# Patient Record
Sex: Female | Born: 1935 | Race: White | Hispanic: No | Smoking: Never smoker
Health system: Southern US, Community
[De-identification: ages and names within clinical notes are randomized; demographics above are authoritative.]

## PROBLEM LIST (undated history)

## (undated) ENCOUNTER — Emergency Department (HOSPITAL_BASED_OUTPATIENT_CLINIC_OR_DEPARTMENT_OTHER): Discharge status: Against Medical Advice | Payer: Medicare Other

## (undated) DIAGNOSIS — E079 Disorder of thyroid, unspecified: Secondary | ICD-10-CM

## (undated) DIAGNOSIS — D649 Anemia, unspecified: Secondary | ICD-10-CM

## (undated) DIAGNOSIS — N200 Calculus of kidney: Secondary | ICD-10-CM

## (undated) DIAGNOSIS — I10 Essential (primary) hypertension: Secondary | ICD-10-CM

## (undated) DIAGNOSIS — K219 Gastro-esophageal reflux disease without esophagitis: Secondary | ICD-10-CM

## (undated) DIAGNOSIS — F0390 Unspecified dementia without behavioral disturbance: Secondary | ICD-10-CM

## (undated) HISTORY — PX: KIDNEY STONE SURGERY: SHX686

---

## 1999-03-04 ENCOUNTER — Encounter: Payer: Self-pay | Admitting: Emergency Medicine

## 1999-03-04 ENCOUNTER — Inpatient Hospital Stay (HOSPITAL_COMMUNITY): Admission: EM | Admit: 1999-03-04 | Discharge: 1999-03-05 | Payer: Self-pay | Admitting: Emergency Medicine

## 1999-06-26 ENCOUNTER — Emergency Department (HOSPITAL_COMMUNITY): Admission: EM | Admit: 1999-06-26 | Discharge: 1999-06-26 | Payer: Self-pay | Admitting: *Deleted

## 1999-08-25 ENCOUNTER — Encounter: Payer: Self-pay | Admitting: *Deleted

## 1999-08-25 ENCOUNTER — Encounter: Admission: RE | Admit: 1999-08-25 | Discharge: 1999-08-25 | Payer: Self-pay | Admitting: *Deleted

## 2000-03-08 ENCOUNTER — Other Ambulatory Visit: Admission: RE | Admit: 2000-03-08 | Discharge: 2000-03-08 | Payer: Self-pay | Admitting: *Deleted

## 2001-12-13 ENCOUNTER — Encounter: Payer: Self-pay | Admitting: *Deleted

## 2001-12-13 ENCOUNTER — Encounter: Admission: RE | Admit: 2001-12-13 | Discharge: 2001-12-13 | Payer: Self-pay | Admitting: *Deleted

## 2003-03-27 ENCOUNTER — Ambulatory Visit (HOSPITAL_COMMUNITY): Admission: RE | Admit: 2003-03-27 | Discharge: 2003-03-27 | Payer: Self-pay

## 2004-03-11 ENCOUNTER — Encounter: Admission: RE | Admit: 2004-03-11 | Discharge: 2004-03-11 | Payer: Self-pay | Admitting: Family Medicine

## 2005-04-13 ENCOUNTER — Inpatient Hospital Stay (HOSPITAL_COMMUNITY): Admission: EM | Admit: 2005-04-13 | Discharge: 2005-04-14 | Payer: Self-pay | Admitting: Emergency Medicine

## 2005-07-10 ENCOUNTER — Emergency Department (HOSPITAL_COMMUNITY): Admission: EM | Admit: 2005-07-10 | Discharge: 2005-07-10 | Payer: Self-pay | Admitting: Emergency Medicine

## 2005-09-15 ENCOUNTER — Encounter (HOSPITAL_COMMUNITY): Admission: RE | Admit: 2005-09-15 | Discharge: 2005-12-14 | Payer: Self-pay | Admitting: Nephrology

## 2005-12-23 ENCOUNTER — Encounter (HOSPITAL_COMMUNITY): Admission: RE | Admit: 2005-12-23 | Discharge: 2006-03-23 | Payer: Self-pay | Admitting: Nephrology

## 2006-02-09 ENCOUNTER — Ambulatory Visit: Payer: Self-pay

## 2006-04-08 ENCOUNTER — Encounter (HOSPITAL_COMMUNITY): Admission: RE | Admit: 2006-04-08 | Discharge: 2006-07-02 | Payer: Self-pay | Admitting: Nephrology

## 2006-07-06 ENCOUNTER — Emergency Department (HOSPITAL_COMMUNITY): Admission: EM | Admit: 2006-07-06 | Discharge: 2006-07-06 | Payer: Self-pay | Admitting: Emergency Medicine

## 2006-07-29 ENCOUNTER — Encounter (HOSPITAL_COMMUNITY): Admission: RE | Admit: 2006-07-29 | Discharge: 2006-10-27 | Payer: Self-pay | Admitting: Nephrology

## 2006-10-05 ENCOUNTER — Observation Stay (HOSPITAL_COMMUNITY): Admission: EM | Admit: 2006-10-05 | Discharge: 2006-10-06 | Payer: Self-pay | Admitting: Emergency Medicine

## 2006-10-16 ENCOUNTER — Emergency Department (HOSPITAL_COMMUNITY): Admission: EM | Admit: 2006-10-16 | Discharge: 2006-10-16 | Payer: Self-pay | Admitting: Emergency Medicine

## 2006-10-28 ENCOUNTER — Encounter: Admission: RE | Admit: 2006-10-28 | Discharge: 2006-10-28 | Payer: Self-pay | Admitting: Family Medicine

## 2006-11-23 ENCOUNTER — Emergency Department (HOSPITAL_COMMUNITY): Admission: EM | Admit: 2006-11-23 | Discharge: 2006-11-23 | Payer: Self-pay | Admitting: Emergency Medicine

## 2006-11-25 ENCOUNTER — Encounter (HOSPITAL_COMMUNITY): Admission: RE | Admit: 2006-11-25 | Discharge: 2007-02-23 | Payer: Self-pay | Admitting: Nephrology

## 2007-03-22 ENCOUNTER — Encounter (HOSPITAL_COMMUNITY): Admission: RE | Admit: 2007-03-22 | Discharge: 2007-06-20 | Payer: Self-pay | Admitting: Nephrology

## 2007-06-21 ENCOUNTER — Encounter (HOSPITAL_COMMUNITY): Admission: RE | Admit: 2007-06-21 | Discharge: 2007-09-19 | Payer: Self-pay | Admitting: Nephrology

## 2007-07-29 ENCOUNTER — Emergency Department (HOSPITAL_COMMUNITY): Admission: EM | Admit: 2007-07-29 | Discharge: 2007-07-29 | Payer: Self-pay | Admitting: Emergency Medicine

## 2007-09-23 ENCOUNTER — Encounter (HOSPITAL_COMMUNITY): Admission: RE | Admit: 2007-09-23 | Discharge: 2007-10-27 | Payer: Self-pay | Admitting: Nephrology

## 2007-11-24 ENCOUNTER — Encounter (HOSPITAL_COMMUNITY): Admission: RE | Admit: 2007-11-24 | Discharge: 2008-02-22 | Payer: Self-pay | Admitting: Nephrology

## 2008-01-14 ENCOUNTER — Emergency Department (HOSPITAL_COMMUNITY): Admission: EM | Admit: 2008-01-14 | Discharge: 2008-01-14 | Payer: Self-pay | Admitting: Emergency Medicine

## 2008-01-20 ENCOUNTER — Emergency Department (HOSPITAL_COMMUNITY): Admission: EM | Admit: 2008-01-20 | Discharge: 2008-01-20 | Payer: Self-pay | Admitting: Emergency Medicine

## 2008-02-29 ENCOUNTER — Encounter (HOSPITAL_COMMUNITY): Admission: RE | Admit: 2008-02-29 | Discharge: 2008-05-29 | Payer: Self-pay | Admitting: Nephrology

## 2008-06-20 ENCOUNTER — Encounter (HOSPITAL_COMMUNITY): Admission: RE | Admit: 2008-06-20 | Discharge: 2008-09-18 | Payer: Self-pay | Admitting: Nephrology

## 2008-06-21 ENCOUNTER — Emergency Department (HOSPITAL_BASED_OUTPATIENT_CLINIC_OR_DEPARTMENT_OTHER): Admission: EM | Admit: 2008-06-21 | Discharge: 2008-06-21 | Payer: Self-pay | Admitting: Emergency Medicine

## 2008-07-06 ENCOUNTER — Emergency Department (HOSPITAL_BASED_OUTPATIENT_CLINIC_OR_DEPARTMENT_OTHER): Admission: EM | Admit: 2008-07-06 | Discharge: 2008-07-06 | Payer: Self-pay | Admitting: Emergency Medicine

## 2008-09-19 ENCOUNTER — Encounter (HOSPITAL_COMMUNITY): Admission: RE | Admit: 2008-09-19 | Discharge: 2008-10-18 | Payer: Self-pay | Admitting: Nephrology

## 2008-11-13 ENCOUNTER — Encounter (HOSPITAL_COMMUNITY): Admission: RE | Admit: 2008-11-13 | Discharge: 2009-02-11 | Payer: Self-pay | Admitting: Nephrology

## 2009-02-20 ENCOUNTER — Encounter (HOSPITAL_COMMUNITY): Admission: RE | Admit: 2009-02-20 | Discharge: 2009-05-21 | Payer: Self-pay | Admitting: Nephrology

## 2009-04-27 ENCOUNTER — Emergency Department (HOSPITAL_BASED_OUTPATIENT_CLINIC_OR_DEPARTMENT_OTHER): Admission: EM | Admit: 2009-04-27 | Discharge: 2009-04-27 | Payer: Self-pay | Admitting: Emergency Medicine

## 2009-06-11 ENCOUNTER — Encounter (HOSPITAL_COMMUNITY): Admission: RE | Admit: 2009-06-11 | Discharge: 2009-09-09 | Payer: Self-pay | Admitting: Nephrology

## 2009-08-26 ENCOUNTER — Emergency Department (HOSPITAL_BASED_OUTPATIENT_CLINIC_OR_DEPARTMENT_OTHER): Admission: EM | Admit: 2009-08-26 | Discharge: 2009-08-26 | Payer: Self-pay | Admitting: Psychiatry

## 2009-08-26 ENCOUNTER — Ambulatory Visit: Payer: Self-pay | Admitting: Diagnostic Radiology

## 2009-09-10 ENCOUNTER — Encounter (HOSPITAL_COMMUNITY): Admission: RE | Admit: 2009-09-10 | Discharge: 2009-12-09 | Payer: Self-pay | Admitting: Nephrology

## 2009-09-18 ENCOUNTER — Ambulatory Visit: Payer: Self-pay | Admitting: Radiology

## 2009-09-18 ENCOUNTER — Emergency Department (HOSPITAL_BASED_OUTPATIENT_CLINIC_OR_DEPARTMENT_OTHER): Admission: EM | Admit: 2009-09-18 | Discharge: 2009-09-18 | Payer: Self-pay | Admitting: Emergency Medicine

## 2009-12-18 ENCOUNTER — Encounter (HOSPITAL_COMMUNITY): Admission: RE | Admit: 2009-12-18 | Discharge: 2010-03-18 | Payer: Self-pay | Admitting: Nephrology

## 2010-01-13 ENCOUNTER — Ambulatory Visit: Payer: Self-pay | Admitting: Diagnostic Radiology

## 2010-01-13 ENCOUNTER — Emergency Department (HOSPITAL_BASED_OUTPATIENT_CLINIC_OR_DEPARTMENT_OTHER): Admission: EM | Admit: 2010-01-13 | Discharge: 2010-01-13 | Payer: Self-pay | Admitting: Emergency Medicine

## 2010-03-26 ENCOUNTER — Encounter (HOSPITAL_COMMUNITY): Admission: RE | Admit: 2010-03-26 | Discharge: 2010-06-24 | Payer: Self-pay | Admitting: Nephrology

## 2010-07-02 ENCOUNTER — Encounter (HOSPITAL_COMMUNITY)
Admission: RE | Admit: 2010-07-02 | Discharge: 2010-09-30 | Payer: Self-pay | Source: Home / Self Care | Attending: Nephrology | Admitting: Nephrology

## 2010-08-10 ENCOUNTER — Emergency Department (HOSPITAL_BASED_OUTPATIENT_CLINIC_OR_DEPARTMENT_OTHER): Admission: EM | Admit: 2010-08-10 | Discharge: 2010-08-10 | Payer: Self-pay | Admitting: Emergency Medicine

## 2010-10-15 ENCOUNTER — Observation Stay (HOSPITAL_COMMUNITY)
Admission: EM | Admit: 2010-10-15 | Discharge: 2010-10-15 | Payer: Self-pay | Source: Home / Self Care | Attending: Internal Medicine | Admitting: Internal Medicine

## 2010-11-05 ENCOUNTER — Encounter (HOSPITAL_COMMUNITY)
Admission: RE | Admit: 2010-11-05 | Discharge: 2010-11-18 | Payer: Self-pay | Source: Home / Self Care | Attending: Nephrology | Admitting: Nephrology

## 2010-11-08 ENCOUNTER — Observation Stay (HOSPITAL_COMMUNITY): Admission: EM | Admit: 2010-11-08 | Discharge: 2010-11-09 | Payer: Self-pay | Source: Home / Self Care

## 2010-11-08 NOTE — H&P (Addendum)
NAMEMarland Newman  Tonya, Newman             ACCOUNT NO.:  [id number]  MEDICAL RECORD NO.:  [id number]          PATIENT TYPE:  EMS  LOCATION:  MAJO                         FACILITY:  [facility]  PHYSICIAN:  Homero Fellers, MD   DATE OF BIRTH:  12/28/1910  DATE OF ADMISSION:  11/08/2010 DATE OF DISCHARGE:                             HISTORY & PHYSICAL   PRIMARY CARE PHYSICIAN:  Dr. Riley Nearing.  She is unassigned  CHIEF COMPLAINTS:  Confusion, weakness and gait problems.  HISTORY OF PRESENT ILLNESS:  This is a 77 year old woman who lives at home with family at times, but also lives by herself during the day. She was noticed by family to be very confused today and quite weak also mainly on left side.  The patient also was unable to work as she normally could do in the past.  The family noticed that she was going to the left side when they tried to sit up in chair and she could not stand on her feet.  The patient was also confused for about several hours which was getting better about 2 hours ago.  The patient denies any chest pain, shortness of breath, nausea, vomiting or diaphoresis.  She had a stroke about 6 years ago with left-sided weakness which cleared up within 2 months.  She is currently on Plavix for this.  The patient did not have any significant speech changes, seizure activity noticeable by family members.  About 2 days ago, she did fell out of bed but this cleared up before to this episode.  PAST MEDICAL HISTORY:  History of CVA with no current residual weakness, high blood pressure, anxiety disorder, hypothyroidism, gastroesophageal disease, history of skin cancer.  MEDICATION:  Amlodipine, lorazepam, Plavix, and levothyroxine.  ALLERGIES:  To ASPIRIN and NITROFURANTOIN.  SOCIAL HISTORY:  No smoking or alcohol.  The patient lives alone but family members stay with her at night.  FAMILY HISTORY:  Contributory.  REVIEW OF SYSTEMS:  10-point review of system is essentially  negative except as above.  There is no urinary symptoms, vomiting, diarrhea or abdominal pain.  No history to suggest hematuria or active kidney stone problems at this time.  PHYSICAL EXAMINATION:  VITAL SIGNS:  Blood pressure 114/55 to 164/88, pulse 77, respirations 14, temperature 98.4, O2 sat 99% on room air. GENERAL:  The patient is comfortable at this time.  Her confusion seems to have improved. NECK:  Supple.  No JVD, adenopathy or thyromegaly. HEENT:  Mouth is dry. LUNGS:  Clear to auscultation bilaterally. HEART:  S1-S2 with 2/6 systolic murmur at the apex, nonradiating. ABDOMEN:  Full, soft, nontender.  Bowel sounds present.  No masses. EXTREMITIES:  No edema, clubbing or cyanosis. NEUROLOGIC:  The patient appears to be awake and alert x3 at this time. Cranial nerves II-XII appear intact.  Speech is clear.  When I tried to sit her up she was unable to maintain a posture and appeared to go to the left side.  She also seemed to have power of 4/5 in the left lower extremity.  Power is normal in all the other extremities.  Cognition appeared to be preserved in the  upper extremities.  Gait was not tested.  LABORATORY DATA:  Urinalysis unremarkable.  White count is 7.2, hemoglobin 12.0, platelet count 152.  Chemistry; sodium is 140, potassium 3.8, BUN 28, creatinine 1.6, glucose 155.  CT of the head showed no acute intracranial abnormalities with chronic small vessel ischemic changes.  She has a stable chronic lacunar infarct in the posterior limb of right internal capsule probably related to a old stroke.  Cardiac enzymes x1 is normal.  Chest x-ray showed no acute findings.  EKG showed normal sinus rhythm with right axis deviation.  No history of T-wave changes.  ASSESSMENT: 1. This is a 77 year old woman with fairly good health, admitted with     weakness on the left side, gait instability and transient     confusion.  The concern here is to rule out another stroke in this      patient with known history of cerebrovascular accident. 2. Uncontrolled high blood pressure. 3. Clinical dehydration with dry mouth and elevated BUN and     creatinine. 4. History of anxiety.  PLAN:  To admit to telemetry floor observation.  She will get full stroke workup including MRI, MRA of the brain, 2-D echo and carotid Doppler.  We will give her some free fluid for dehydration and follow BNP in 24 hours.  Check lipid profile, cardiac enzymes, B12 and folic acid levels.  The patient will continue on Plavix for now.  If stroke is confirmed, she might need to be changed to Aggrenox.  The patient however does have an ASPIRIN allergy, so this might quite be a challenge.  If MRI showed acute stroke, I will recommend a neurological consultation.  For now, she will be on DVT prophylaxis.  Her condition is stable and 49 minutes spent.     Homero Fellers, MD     FA/MEDQ  D:  11/08/2010  T:  11/08/2010  Job:  [id number]  Electronically Signed by Homero Fellers  on 11/08/2010 11:13:55 PM

## 2010-11-10 LAB — IRON AND TIBC
Iron: 92 ug/dL (ref 42–135)
TIBC: 280 ug/dL (ref 250–470)
UIBC: 188 ug/dL

## 2010-11-10 LAB — FERRITIN: Ferritin: 138 ng/mL (ref 10–291)

## 2010-11-10 LAB — POCT HEMOGLOBIN-HEMACUE: Hemoglobin: 11.8 g/dL — ABNORMAL LOW (ref 12.0–15.0)

## 2010-11-11 LAB — CARDIAC PANEL(CRET KIN+CKTOT+MB+TROPI)
CK, MB: 1.4 ng/mL (ref 0.3–4.0)
Relative Index: INVALID (ref 0.0–2.5)
Relative Index: INVALID (ref 0.0–2.5)
Total CK: 60 U/L (ref 7–177)
Total CK: 60 U/L (ref 7–177)
Troponin I: 0.01 ng/mL (ref 0.00–0.06)

## 2010-11-11 LAB — BASIC METABOLIC PANEL
CO2: 27 mEq/L (ref 19–32)
Chloride: 105 mEq/L (ref 96–112)
GFR calc Af Amer: 44 mL/min — ABNORMAL LOW (ref >60)
Sodium: 142 mEq/L (ref 135–145)

## 2010-11-11 LAB — POCT I-STAT, CHEM 8
BUN: 28 mg/dL — ABNORMAL HIGH (ref 6–23)
Chloride: 108 mEq/L (ref 96–112)
Potassium: 3.8 mEq/L (ref 3.5–5.1)
Sodium: 140 mEq/L (ref 135–145)

## 2010-11-11 LAB — FOLATE: Folate: >20 ng/mL

## 2010-11-11 LAB — POCT CARDIAC MARKERS
CKMB, poc: 2.2 ng/mL (ref 1.0–8.0)
Myoglobin, poc: 162 ng/mL (ref 12–200)
Troponin i, poc: <0.05 ng/mL (ref 0.00–0.09)

## 2010-11-11 LAB — MAGNESIUM: Magnesium: 2.4 mg/dL (ref 1.5–2.5)

## 2010-11-11 LAB — CK TOTAL AND CKMB (NOT AT ARMC)
CK, MB: 1.9 ng/mL (ref 0.3–4.0)
Relative Index: INVALID (ref 0.0–2.5)

## 2010-11-11 LAB — URINALYSIS, ROUTINE W REFLEX MICROSCOPIC
Bilirubin Urine: NEGATIVE
Protein, ur: NEGATIVE mg/dL

## 2010-11-11 LAB — CBC
Hemoglobin: 12 g/dL (ref 12.0–15.0)
MCH: 31.3 pg (ref 26.0–34.0)
Platelets: 182 10*3/uL (ref 150–400)
RBC: 3.84 MIL/uL — ABNORMAL LOW (ref 3.87–5.11)
RDW: 13.1 % (ref 11.5–15.5)

## 2010-11-11 LAB — LIPID PANEL
Cholesterol: 205 mg/dL — ABNORMAL HIGH (ref 0–200)
LDL Cholesterol: 135 mg/dL — ABNORMAL HIGH (ref 0–99)
VLDL: 27 mg/dL (ref 0–40)

## 2010-11-11 LAB — DIFFERENTIAL
Eosinophils Relative: 1 % (ref 0–5)
Monocytes Relative: 9 % (ref 3–12)
Neutro Abs: 4.9 10*3/uL (ref 1.7–7.7)
Neutrophils Relative %: 67 % (ref 43–77)

## 2010-11-11 LAB — URINE CULTURE: Colony Count: 7000

## 2010-11-11 LAB — PROTIME-INR
INR: 0.91 (ref 0.00–1.49)
Prothrombin Time: 12.5 seconds (ref 11.6–15.2)

## 2010-11-11 LAB — VITAMIN B12: Vitamin B-12: 391 pg/mL (ref 211–911)

## 2010-11-13 NOTE — Discharge Summary (Addendum)
Tonya Newman, Tonya Newman             ACCOUNT NO.:  [id number]  MEDICAL RECORD NO.:  [id number]          PATIENT TYPE:  INP  LOCATION:  3701                         FACILITY:  [facility]  PHYSICIAN:  Hollice Espy, M.D.DATE OF BIRTH:  [birth year]  DATE OF ADMISSION:  11/08/2010 DATE OF DISCHARGE:  11/09/2010                              DISCHARGE SUMMARY   PRIMARY CARE PHYSICIAN:  Bernadette Hoit, MD, [facility], [place].  DISCHARGE DIAGNOSES: 1. Generalized weakness, resolved. 2. Altered mental status, resolved. 3. Acute renal failure, on stage II chronic kidney disease now back at     baseline.  DISCHARGE MEDICATIONS: 1. Norvasc 10 p.o. daily. 2. Folate 1 mg p.o. daily. 3. Synthroid 50 mcg p.o. daily. 4. Lorazepam 0.5 p.o. b.i.d. p.r.n. 5. Plavix 75 p.o. daily.  DISCHARGE DIET:  Heart-healthy diet.  ACTIVITY:  Slow to increased.  The patient is cleared to ambulate by herself.  DISPOSITION:  Improved.  HOSPITAL COURSE:  The patient is a 77 year old white female discharged from the Hospitalist Service.  Last month, she had had a diagnosis of a stroke at an indeterminate time.  She came back in and that she has been in excellent health, lived by herself, very active including mowing the lawn, cooking and cleaning.  When her family noted that she seemed to have some problems with confusion on November 08, 2010 as well as seemed to have some have generalized weakness, there was a concern that she may have a new stroke as there were some reports that she perhaps was favoring her left side.  However, when she was evaluated, she was noted to have a creatinine of 1.6 with an increased BUN.  Family reported that 2 days prior, the patient had fallen out of bed when she tripped in the middle of the night.  She was additionally evaluated.  Initial CT scan of the head and chest x-ray were negative.  No signs of any infection. No signs of urinary tract infection.   The patient is not on any diuretics and her blood pressures have remained stable assuring no symptoms of orthostasis or severe hypertension.  Following IV fluids, her followup labs were 1.3 and the patient does have normal full mentation.  She is able to converse.  Family feels that she is at baseline.  When the patient was ambulated, she used her IV pole for balance with wheels and was able to ambulate rather quickly and very well with a quite steady balance.  She had no focal neurological findings and her exam is completely unremarkable.  After discussion with the patient and the family, I feel that likely what has happened is this patient fell out of bed several days ago that led to some mild dehydration confirmed by her increased creatinine to 1.6.  Following hydration, she is back to her baseline and now she is able to relatively ambulate well.  The patient normally walks unassisted.  The family says that occasionally she might use objects in her house such as the washer while walking by to steady herself.  They have tried to get her to use a cane which she is  resistant to, however, I noted that with a wheeled IV pole, the patient actually has even better balance and is able to ambulate quite quickly and I advised the family that if they wanted her to use an object for balance recommend something with wheels.  I advised the patient to be able to discharge home. Disposition, improved. Activity is slow to increase.  The family will stay with her tonight and she spends every night with her son.  She will follow up with Dr. Riley Nearing in the next few weeks.     Hollice Espy, M.D.     SKK/MEDQ  D:  11/09/2010  T:  11/10/2010  Job:  [id number]  cc:   Bernadette Hoit, M.D.  Electronically Signed by Virginia Rochester M.D. on 11/13/2010 08:09:14 AM

## 2010-11-14 NOTE — H&P (Signed)
NAMEMarland Kitchen  Tonya Newman, Tonya Newman NO.:  [id number]  MEDICAL RECORD NO.:  [id number]          PATIENT TYPE:  EMS  LOCATION:  MAJO                         FACILITY:  [facility]  PHYSICIAN:  Massie Maroon, MD        DATE OF BIRTH:  [birth year]  DATE OF ADMISSION:  10/15/2010 DATE OF DISCHARGE:                             HISTORY & PHYSICAL   A 77 year old female with a history of hypertension, stroke, hypothyroidism, apparently complains of feeling badly.  The patient was brought to the ED by her family by EMS, and apparently ER thought that she had complained of some chest pain and did an EKG which showed some ST depression in the lateral leads.  ER talked to Dr. Raphael Gibney on for Cardiology who thought that this was not a cardiac issue.  The patient herself complains of being asymptomatic.  She said that her bad spell "anxiety."  The patient states that she feels fine and actually desires to go home at this point.  ER wishes her be admitted for observation for another set of cardiac markers in light of her EKG changes.  PAST MEDICAL HISTORY: 1. Hypertension. 2. CVA. 3. Hypothyroidism. 4. GERD. 5. Nephrolithiasis. 6. Skin cancer.  PAST SURGICAL HISTORY:  Skin cancer removal from her nose as well as left neck.  SOCIAL HISTORY:  The patient lives alone at home.  She has never smoked. [place]  FAMILY HISTORY:  None per the patient.  ALLERGIES:  ASPIRIN, NITROFURANTOIN.  MEDICATIONS: 1. Amlodipine 5 mg p.o. daily. 2. Lac-Hydrin topically b.i.d. 3. Aranesp 100 mcg as needed. 4. Folic acid 400 mcg p.o. daily. 5. Levothyroxine 50 mcg p.o. daily. 6. Lisinopril 10 mg p.o. daily. 7. Lorazepam 0.5 mg p.o. b.i.d. p.r.n. 8. Omeprazole 20 mg p.o. daily. 9. Oxybutynin 5 mg p.o. daily. 10.Plavix 75 mg p.o. daily. 11.Valacyclovir 1 g p.o. t.i.d.  REVIEW OF SYSTEMS:  Negative for all 10-organ systems except for pertinent positives as stated above.  PHYSICAL  EXAMINATION:  VITAL SIGNS:  Temperature 98.0, pulse 77, blood pressure is 171/70, pulse ox 98% on room air. HEENT:  Anicteric. NECK:  No JVD.  Positive for bilateral carotid bruits. HEART:  Regular rate and rhythm.  S1 and S2 with a 2/6 systolic ejection murmur with radiation to the neck, 2/6 systolic ejection murmur at the apex. LUNGS:  Clear to auscultation bilaterally. ABDOMEN:  Soft, nontender, nondistended.  Positive bowel sounds. EXTREMITIES:  No cyanosis, clubbing, or edema. SKIN:  No rashes, lymph nodes.  No adenopathy. NEURO:  Nonfocal.  Cranial nerves II through XII intact.  Reflexes 2+, symmetric, diffuse with downgoing toes bilaterally.  Motor strength 5/5 in all four extremities, pinprick intact.  LABORATORY DATA:  Sodium 145, potassium 3.7, BUN 24, creatinine 1.31. WBC 7.9, hemoglobin 13.2, platelet count 207.  Troponin-I less than 0.05.  Chest x-ray, borderline cardiomegaly, mild changes of COPD with evidence of pulmonary artery hypertension.  ASSESSMENT AND PLAN: 1. Hypertension, uncontrolled.  The patient will be started on     carvedilol 6.25 mg p.o. b.i.d.  Continue lisinopril 10 mg p.o.  daily and amlodipine 5 mg p.o. daily. 2. Abnormal EKG, ST depression in the lateral lead.  Continue Plavix.     Add Crestor 20 mg p.o. nightly.  Add carvedilol 6.25 mg p.o. b.i.d.     Continue amlodipine and lisinopril.  Consider nuclear stress test     in the a.m. 3. Hypothyroidism.  Continue levothyroxine. 4. Gastroesophageal reflux disease.  Continue omeprazole. 5. History of anemia.  Continue Aranesp as an outpatient. 6. Code status.  DNR/DNI per the patient.     Massie Maroon, MD     JYK/MEDQ  D:  10/15/2010  T:  10/15/2010  Job:  [id number]  cc:   Burnett Kanaris physician's assistant [facility]  Electronically Signed by Pearson Grippe MD on 11/14/2010 08:37:58 PM

## 2010-12-17 ENCOUNTER — Encounter (HOSPITAL_COMMUNITY): Payer: Medicare Other | Attending: Nephrology

## 2010-12-17 ENCOUNTER — Other Ambulatory Visit: Payer: Self-pay | Admitting: Nephrology

## 2010-12-17 ENCOUNTER — Other Ambulatory Visit: Payer: Self-pay

## 2010-12-17 DIAGNOSIS — N184 Chronic kidney disease, stage 4 (severe): Secondary | ICD-10-CM | POA: Insufficient documentation

## 2010-12-17 DIAGNOSIS — D638 Anemia in other chronic diseases classified elsewhere: Secondary | ICD-10-CM | POA: Insufficient documentation

## 2010-12-17 LAB — RENAL FUNCTION PANEL
Albumin: 3.8 g/dL (ref 3.5–5.2)
CO2: 28 mEq/L (ref 19–32)
Calcium: 9.5 mg/dL (ref 8.4–10.5)
Creatinine, Ser: 1.42 mg/dL — ABNORMAL HIGH (ref 0.4–1.2)
GFR calc Af Amer: 41 mL/min — ABNORMAL LOW (ref >60)
GFR calc non Af Amer: 34 mL/min — ABNORMAL LOW (ref >60)
Phosphorus: 3.3 mg/dL (ref 2.3–4.6)
Sodium: 140 mEq/L (ref 135–145)

## 2010-12-17 LAB — IRON AND TIBC
Iron: 65 ug/dL (ref 42–135)
UIBC: 202 ug/dL

## 2010-12-29 LAB — POCT CARDIAC MARKERS
CKMB, poc: 2.9 ng/mL (ref 1.0–8.0)
Myoglobin, poc: >500 ng/mL (ref 12–200)
Myoglobin, poc: >500 ng/mL (ref 12–200)

## 2010-12-29 LAB — CK TOTAL AND CKMB (NOT AT ARMC)
CK, MB: 3.7 ng/mL (ref 0.3–4.0)
Relative Index: 1.9 (ref 0.0–2.5)
Total CK: 195 U/L — ABNORMAL HIGH (ref 7–177)

## 2010-12-29 LAB — CBC
HCT: 37.1 % (ref 36.0–46.0)
HCT: 39.8 % (ref 36.0–46.0)
Hemoglobin: 12.2 g/dL (ref 12.0–15.0)
Hemoglobin: 13.2 g/dL (ref 12.0–15.0)
MCH: 31.9 pg (ref 26.0–34.0)
MCHC: 33.2 g/dL (ref 30.0–36.0)
MCV: 96.1 fL (ref 78.0–100.0)
MCV: 96.1 fL (ref 78.0–100.0)
RBC: 3.86 MIL/uL — ABNORMAL LOW (ref 3.87–5.11)
RBC: 4.14 MIL/uL (ref 3.87–5.11)
WBC: 7.8 10*3/uL (ref 4.0–10.5)

## 2010-12-29 LAB — BASIC METABOLIC PANEL
CO2: 31 mEq/L (ref 19–32)
Chloride: 106 mEq/L (ref 96–112)
GFR calc Af Amer: 45 mL/min — ABNORMAL LOW (ref >60)
Glucose, Bld: 119 mg/dL — ABNORMAL HIGH (ref 70–99)
Potassium: 3.7 mEq/L (ref 3.5–5.1)
Sodium: 145 mEq/L (ref 135–145)

## 2010-12-29 LAB — IRON AND TIBC: Iron: 75 ug/dL (ref 42–135)

## 2010-12-29 LAB — RENAL FUNCTION PANEL
Albumin: 3.5 g/dL (ref 3.5–5.2)
BUN: 25 mg/dL — ABNORMAL HIGH (ref 6–23)
CO2: 27 mEq/L (ref 19–32)
Chloride: 107 mEq/L (ref 96–112)
Potassium: 4.3 mEq/L (ref 3.5–5.1)

## 2010-12-29 LAB — COMPREHENSIVE METABOLIC PANEL
ALT: 21 U/L (ref 0–35)
Alkaline Phosphatase: 57 U/L (ref 39–117)
BUN: 23 mg/dL (ref 6–23)
CO2: 29 mEq/L (ref 19–32)
Chloride: 107 mEq/L (ref 96–112)
GFR calc non Af Amer: 37 mL/min — ABNORMAL LOW (ref >60)
Glucose, Bld: 92 mg/dL (ref 70–99)
Potassium: 4.1 mEq/L (ref 3.5–5.1)
Sodium: 143 mEq/L (ref 135–145)
Total Bilirubin: 0.2 mg/dL — ABNORMAL LOW (ref 0.3–1.2)
Total Protein: 6.4 g/dL (ref 6.0–8.3)

## 2010-12-29 LAB — CARDIAC PANEL(CRET KIN+CKTOT+MB+TROPI)
CK, MB: 4 ng/mL (ref 0.3–4.0)
Total CK: 203 U/L — ABNORMAL HIGH (ref 7–177)
Troponin I: 0.02 ng/mL (ref 0.00–0.06)

## 2010-12-29 LAB — APTT: aPTT: 30 seconds (ref 24–37)

## 2010-12-29 LAB — DIFFERENTIAL
Basophils Relative: 1 % (ref 0–1)
Eosinophils Absolute: 0.1 10*3/uL (ref 0.0–0.7)
Lymphs Abs: 2.4 10*3/uL (ref 0.7–4.0)
Monocytes Absolute: 0.7 10*3/uL (ref 0.1–1.0)
Monocytes Relative: 9 % (ref 3–12)
Neutrophils Relative %: 59 % (ref 43–77)

## 2010-12-29 LAB — FERRITIN: Ferritin: 146 ng/mL (ref 10–291)

## 2010-12-29 LAB — PROTIME-INR: INR: 0.9 (ref 0.00–1.49)

## 2010-12-31 LAB — BASIC METABOLIC PANEL
Calcium: 10 mg/dL (ref 8.4–10.5)
Chloride: 107 mEq/L (ref 96–112)
Creatinine, Ser: 1.4 mg/dL — ABNORMAL HIGH (ref 0.4–1.2)
GFR calc Af Amer: 42 mL/min — ABNORMAL LOW (ref >60)
Sodium: 149 mEq/L — ABNORMAL HIGH (ref 135–145)

## 2010-12-31 LAB — URINALYSIS, ROUTINE W REFLEX MICROSCOPIC
Ketones, ur: NEGATIVE mg/dL
Leukocytes, UA: NEGATIVE
Nitrite: NEGATIVE
Protein, ur: 30 mg/dL — AB
Urobilinogen, UA: 0.2 mg/dL (ref 0.0–1.0)

## 2010-12-31 LAB — POCT CARDIAC MARKERS
CKMB, poc: 1.5 ng/mL (ref 1.0–8.0)
Myoglobin, poc: 110 ng/mL (ref 12–200)
Troponin i, poc: <0.05 ng/mL (ref 0.00–0.09)

## 2010-12-31 LAB — DIFFERENTIAL
Lymphocytes Relative: 29 % (ref 12–46)
Lymphs Abs: 2.2 10*3/uL (ref 0.7–4.0)
Neutrophils Relative %: 60 % (ref 43–77)

## 2010-12-31 LAB — URINE MICROSCOPIC-ADD ON

## 2010-12-31 LAB — URINE CULTURE
Colony Count: NO GROWTH
Culture  Setup Time: 201110231559

## 2010-12-31 LAB — CBC
Platelets: 207 10*3/uL (ref 150–400)
RBC: 4.01 MIL/uL (ref 3.87–5.11)
WBC: 7.5 10*3/uL (ref 4.0–10.5)

## 2010-12-31 LAB — POCT HEMOGLOBIN-HEMACUE: Hemoglobin: 11.1 g/dL — ABNORMAL LOW (ref 12.0–15.0)

## 2011-01-01 LAB — POCT HEMOGLOBIN-HEMACUE: Hemoglobin: 11.1 g/dL — ABNORMAL LOW (ref 12.0–15.0)

## 2011-01-02 LAB — RENAL FUNCTION PANEL
Glucose, Bld: 96 mg/dL (ref 70–99)
Phosphorus: 3.6 mg/dL (ref 2.3–4.6)
Potassium: 4 mEq/L (ref 3.5–5.1)
Sodium: 142 mEq/L (ref 135–145)

## 2011-01-02 LAB — IRON AND TIBC: UIBC: 153 ug/dL

## 2011-01-02 LAB — POCT HEMOGLOBIN-HEMACUE: Hemoglobin: 13.1 g/dL (ref 12.0–15.0)

## 2011-01-03 LAB — IRON AND TIBC
Iron: 77 ug/dL (ref 42–135)
UIBC: 190 ug/dL

## 2011-01-03 LAB — RENAL FUNCTION PANEL
BUN: 32 mg/dL — ABNORMAL HIGH (ref 6–23)
CO2: 27 mEq/L (ref 19–32)
Chloride: 103 mEq/L (ref 96–112)
Glucose, Bld: 107 mg/dL — ABNORMAL HIGH (ref 70–99)
Potassium: 4.7 mEq/L (ref 3.5–5.1)

## 2011-01-04 LAB — POCT HEMOGLOBIN-HEMACUE
Hemoglobin: 12.3 g/dL (ref 12.0–15.0)
Hemoglobin: 10.9 g/dL — ABNORMAL LOW (ref 12.0–15.0)

## 2011-01-05 LAB — POCT HEMOGLOBIN-HEMACUE: Hemoglobin: 12.5 g/dL (ref 12.0–15.0)

## 2011-01-06 LAB — IRON AND TIBC
Iron: 73 ug/dL (ref 42–135)
Saturation Ratios: 28 % (ref 20–55)

## 2011-01-06 LAB — POCT HEMOGLOBIN-HEMACUE: Hemoglobin: 11.8 g/dL — ABNORMAL LOW (ref 12.0–15.0)

## 2011-01-06 LAB — RENAL FUNCTION PANEL
CO2: 26 mEq/L (ref 19–32)
Glucose, Bld: 90 mg/dL (ref 70–99)
Potassium: 4.4 mEq/L (ref 3.5–5.1)
Sodium: 138 mEq/L (ref 135–145)

## 2011-01-07 LAB — POCT HEMOGLOBIN-HEMACUE: Hemoglobin: 11.2 g/dL — ABNORMAL LOW (ref 12.0–15.0)

## 2011-01-11 LAB — IRON AND TIBC: Iron: 97 ug/dL (ref 42–135)

## 2011-01-11 LAB — POCT HEMOGLOBIN-HEMACUE: Hemoglobin: 12.2 g/dL (ref 12.0–15.0)

## 2011-01-11 LAB — RENAL FUNCTION PANEL
BUN: 27 mg/dL — ABNORMAL HIGH (ref 6–23)
CO2: 30 mEq/L (ref 19–32)
Chloride: 104 mEq/L (ref 96–112)
Glucose, Bld: 95 mg/dL (ref 70–99)
Potassium: 4.3 mEq/L (ref 3.5–5.1)

## 2011-01-12 LAB — URINALYSIS, ROUTINE W REFLEX MICROSCOPIC
Glucose, UA: NEGATIVE mg/dL
Protein, ur: NEGATIVE mg/dL
Specific Gravity, Urine: 1.006 (ref 1.005–1.030)
pH: 7.5 (ref 5.0–8.0)

## 2011-01-19 LAB — IRON AND TIBC
Iron: 55 ug/dL (ref 42–135)
Saturation Ratios: 21 % (ref 20–55)
UIBC: 203 ug/dL

## 2011-01-20 LAB — BASIC METABOLIC PANEL
CO2: 27 mEq/L (ref 19–32)
Calcium: 9.7 mg/dL (ref 8.4–10.5)
Glucose, Bld: 118 mg/dL — ABNORMAL HIGH (ref 70–99)
Sodium: 148 mEq/L — ABNORMAL HIGH (ref 135–145)

## 2011-01-20 LAB — URINALYSIS, ROUTINE W REFLEX MICROSCOPIC
Bilirubin Urine: NEGATIVE
Ketones, ur: NEGATIVE mg/dL
Protein, ur: NEGATIVE mg/dL
Urobilinogen, UA: 0.2 mg/dL (ref 0.0–1.0)

## 2011-01-20 LAB — CBC
Hemoglobin: 12.1 g/dL (ref 12.0–15.0)
MCHC: 33.9 g/dL (ref 30.0–36.0)
RDW: 14.1 % (ref 11.5–15.5)

## 2011-01-20 LAB — DIFFERENTIAL
Basophils Relative: 1 % (ref 0–1)
Eosinophils Absolute: 0 10*3/uL (ref 0.0–0.7)
Eosinophils Relative: 0 % (ref 0–5)
Monocytes Absolute: 0.7 10*3/uL (ref 0.1–1.0)
Monocytes Relative: 6 % (ref 3–12)
Neutrophils Relative %: 86 % — ABNORMAL HIGH (ref 43–77)

## 2011-01-21 LAB — POCT CARDIAC MARKERS: CKMB, poc: 1.2 ng/mL (ref 1.0–8.0)

## 2011-01-21 LAB — BASIC METABOLIC PANEL
Chloride: 104 mEq/L (ref 96–112)
GFR calc Af Amer: 42 mL/min — ABNORMAL LOW (ref >60)
GFR calc non Af Amer: 35 mL/min — ABNORMAL LOW (ref >60)
Potassium: 4.6 mEq/L (ref 3.5–5.1)

## 2011-01-21 LAB — CBC
HCT: 34.3 % — ABNORMAL LOW (ref 36.0–46.0)
MCV: 92.9 fL (ref 78.0–100.0)
RBC: 3.69 MIL/uL — ABNORMAL LOW (ref 3.87–5.11)
WBC: 6.2 10*3/uL (ref 4.0–10.5)

## 2011-01-21 LAB — DIFFERENTIAL
Eosinophils Absolute: 0.1 10*3/uL (ref 0.0–0.7)
Eosinophils Relative: 1 % (ref 0–5)
Lymphocytes Relative: 19 % (ref 12–46)
Lymphs Abs: 1.2 10*3/uL (ref 0.7–4.0)
Monocytes Relative: 7 % (ref 3–12)
Neutrophils Relative %: 72 % (ref 43–77)

## 2011-01-21 LAB — POCT HEMOGLOBIN-HEMACUE: Hemoglobin: 10.4 g/dL — ABNORMAL LOW (ref 12.0–15.0)

## 2011-01-22 LAB — RENAL FUNCTION PANEL
BUN: 31 mg/dL — ABNORMAL HIGH (ref 6–23)
CO2: 32 mEq/L (ref 19–32)
Calcium: 9.6 mg/dL (ref 8.4–10.5)
Chloride: 105 mEq/L (ref 96–112)
Creatinine, Ser: 1.61 mg/dL — ABNORMAL HIGH (ref 0.4–1.2)
GFR calc Af Amer: 36 mL/min — ABNORMAL LOW (ref >60)
GFR calc non Af Amer: 30 mL/min — ABNORMAL LOW (ref >60)

## 2011-01-22 LAB — FERRITIN: Ferritin: 76 ng/mL (ref 10–291)

## 2011-01-22 LAB — IRON AND TIBC
Iron: 51 ug/dL (ref 42–135)
Saturation Ratios: 18 % — ABNORMAL LOW (ref 20–55)
TIBC: 277 ug/dL (ref 250–470)

## 2011-01-23 LAB — CBC
Hemoglobin: 10.2 g/dL — ABNORMAL LOW (ref 12.0–15.0)
MCHC: 33.5 g/dL (ref 30.0–36.0)
MCV: 93.9 fL (ref 78.0–100.0)
RBC: 3.25 MIL/uL — ABNORMAL LOW (ref 3.87–5.11)
RDW: 14 % (ref 11.5–15.5)

## 2011-01-24 LAB — RENAL FUNCTION PANEL
Albumin: 3.7 g/dL (ref 3.5–5.2)
CO2: 29 mEq/L (ref 19–32)
Chloride: 104 mEq/L (ref 96–112)
GFR calc Af Amer: 37 mL/min — ABNORMAL LOW (ref >60)
GFR calc non Af Amer: 30 mL/min — ABNORMAL LOW (ref >60)
Potassium: 4.3 mEq/L (ref 3.5–5.1)
Sodium: 142 mEq/L (ref 135–145)

## 2011-01-24 LAB — POCT HEMOGLOBIN-HEMACUE: Hemoglobin: 9 g/dL — ABNORMAL LOW (ref 12.0–15.0)

## 2011-01-24 LAB — FERRITIN: Ferritin: 86 ng/mL (ref 10–291)

## 2011-01-24 LAB — IRON AND TIBC: TIBC: 269 ug/dL (ref 250–470)

## 2011-01-25 LAB — POCT CARDIAC MARKERS
CKMB, poc: 2 ng/mL (ref 1.0–8.0)
Troponin i, poc: <0.05 ng/mL (ref 0.00–0.09)

## 2011-01-25 LAB — URINALYSIS, ROUTINE W REFLEX MICROSCOPIC
Glucose, UA: NEGATIVE mg/dL
Ketones, ur: NEGATIVE mg/dL
Nitrite: NEGATIVE
Protein, ur: NEGATIVE mg/dL
Urobilinogen, UA: 0.2 mg/dL (ref 0.0–1.0)

## 2011-01-25 LAB — CBC
Hemoglobin: 11.4 g/dL — ABNORMAL LOW (ref 12.0–15.0)
Platelets: 223 10*3/uL (ref 150–400)
RDW: 13.3 % (ref 11.5–15.5)
WBC: 5.8 10*3/uL (ref 4.0–10.5)

## 2011-01-25 LAB — POCT HEMOGLOBIN-HEMACUE: Hemoglobin: 10.3 g/dL — ABNORMAL LOW (ref 12.0–15.0)

## 2011-01-25 LAB — BASIC METABOLIC PANEL
Calcium: 9.6 mg/dL (ref 8.4–10.5)
GFR calc non Af Amer: 28 mL/min — ABNORMAL LOW (ref >60)
Glucose, Bld: 101 mg/dL — ABNORMAL HIGH (ref 70–99)
Potassium: 4.2 mEq/L (ref 3.5–5.1)
Sodium: 145 mEq/L (ref 135–145)

## 2011-01-25 LAB — DIFFERENTIAL
Basophils Absolute: 0 10*3/uL (ref 0.0–0.1)
Lymphocytes Relative: 25 % (ref 12–46)
Lymphs Abs: 1.4 10*3/uL (ref 0.7–4.0)
Neutro Abs: 3.7 10*3/uL (ref 1.7–7.7)

## 2011-01-25 LAB — URINE MICROSCOPIC-ADD ON

## 2011-01-25 LAB — URINE CULTURE
Colony Count: NO GROWTH
Culture: NO GROWTH

## 2011-01-26 LAB — RENAL FUNCTION PANEL
CO2: 27 mEq/L (ref 19–32)
Chloride: 105 mEq/L (ref 96–112)
Glucose, Bld: 103 mg/dL — ABNORMAL HIGH (ref 70–99)
Potassium: 4.3 mEq/L (ref 3.5–5.1)
Sodium: 140 mEq/L (ref 135–145)

## 2011-01-26 LAB — POCT HEMOGLOBIN-HEMACUE
Hemoglobin: 10.3 g/dL — ABNORMAL LOW (ref 12.0–15.0)
Hemoglobin: 10.3 g/dL — ABNORMAL LOW (ref 12.0–15.0)

## 2011-01-26 LAB — IRON AND TIBC: Iron: 69 ug/dL (ref 42–135)

## 2011-01-27 LAB — RENAL FUNCTION PANEL
BUN: 29 mg/dL — ABNORMAL HIGH (ref 6–23)
CO2: 25 mEq/L (ref 19–32)
Calcium: 9.2 mg/dL (ref 8.4–10.5)
Creatinine, Ser: 1.88 mg/dL — ABNORMAL HIGH (ref 0.4–1.2)
Glucose, Bld: 125 mg/dL — ABNORMAL HIGH (ref 70–99)
Sodium: 144 mEq/L (ref 135–145)

## 2011-01-27 LAB — POCT HEMOGLOBIN-HEMACUE: Hemoglobin: 10.8 g/dL — ABNORMAL LOW (ref 12.0–15.0)

## 2011-01-27 LAB — IRON AND TIBC
Iron: 46 ug/dL (ref 42–135)
Saturation Ratios: 20 % (ref 20–55)
TIBC: 234 ug/dL — ABNORMAL LOW (ref 250–470)

## 2011-01-28 ENCOUNTER — Encounter (HOSPITAL_COMMUNITY): Payer: Medicare Other

## 2011-02-03 LAB — RENAL FUNCTION PANEL
Albumin: 3.4 g/dL — ABNORMAL LOW (ref 3.5–5.2)
BUN: 28 mg/dL — ABNORMAL HIGH (ref 6–23)
Calcium: 9.5 mg/dL (ref 8.4–10.5)
Creatinine, Ser: 1.26 mg/dL — ABNORMAL HIGH (ref 0.4–1.2)
Phosphorus: 3.6 mg/dL (ref 2.3–4.6)
Potassium: 4.1 mEq/L (ref 3.5–5.1)

## 2011-02-03 LAB — FERRITIN: Ferritin: 71 ng/mL (ref 10–291)

## 2011-02-03 LAB — IRON AND TIBC
Iron: 79 ug/dL (ref 42–135)
Saturation Ratios: 30 % (ref 20–55)
TIBC: 266 ug/dL (ref 250–470)
UIBC: 187 ug/dL

## 2011-02-05 ENCOUNTER — Encounter (HOSPITAL_COMMUNITY): Payer: Medicare Other | Attending: Nephrology

## 2011-02-05 ENCOUNTER — Other Ambulatory Visit: Payer: Self-pay | Admitting: Nephrology

## 2011-02-05 DIAGNOSIS — N184 Chronic kidney disease, stage 4 (severe): Secondary | ICD-10-CM | POA: Insufficient documentation

## 2011-02-05 DIAGNOSIS — D638 Anemia in other chronic diseases classified elsewhere: Secondary | ICD-10-CM | POA: Insufficient documentation

## 2011-02-05 LAB — IRON AND TIBC
Saturation Ratios: 22 % (ref 20–55)
TIBC: 252 ug/dL (ref 250–470)
UIBC: 196 ug/dL

## 2011-02-05 LAB — RENAL FUNCTION PANEL
BUN: 26 mg/dL — ABNORMAL HIGH (ref 6–23)
CO2: 28 mEq/L (ref 19–32)
Chloride: 107 mEq/L (ref 96–112)
Creatinine, Ser: 1.43 mg/dL — ABNORMAL HIGH (ref 0.4–1.2)
Glucose, Bld: 117 mg/dL — ABNORMAL HIGH (ref 70–99)
Potassium: 4.2 mEq/L (ref 3.5–5.1)

## 2011-03-06 ENCOUNTER — Emergency Department (HOSPITAL_BASED_OUTPATIENT_CLINIC_OR_DEPARTMENT_OTHER)
Admission: EM | Admit: 2011-03-06 | Discharge: 2011-03-06 | Disposition: A | Discharge status: Home / Self Care | Payer: Medicare Other | Attending: Emergency Medicine | Admitting: Emergency Medicine

## 2011-03-06 DIAGNOSIS — Z8679 Personal history of other diseases of the circulatory system: Secondary | ICD-10-CM | POA: Insufficient documentation

## 2011-03-06 DIAGNOSIS — K219 Gastro-esophageal reflux disease without esophagitis: Secondary | ICD-10-CM | POA: Insufficient documentation

## 2011-03-06 DIAGNOSIS — E039 Hypothyroidism, unspecified: Secondary | ICD-10-CM | POA: Insufficient documentation

## 2011-03-06 DIAGNOSIS — L98499 Non-pressure chronic ulcer of skin of other sites with unspecified severity: Secondary | ICD-10-CM | POA: Insufficient documentation

## 2011-03-06 DIAGNOSIS — I10 Essential (primary) hypertension: Secondary | ICD-10-CM | POA: Insufficient documentation

## 2011-03-06 DIAGNOSIS — F411 Generalized anxiety disorder: Secondary | ICD-10-CM | POA: Insufficient documentation

## 2011-03-06 DIAGNOSIS — Z79899 Other long term (current) drug therapy: Secondary | ICD-10-CM | POA: Insufficient documentation

## 2011-03-06 NOTE — H&P (Signed)
NAMEMarland Newman  DESTYNEE, STRINGFELLOW             ACCOUNT NO.:  [id number]   MEDICAL RECORD NO.:  [id number]          PATIENT TYPE:  EMS   LOCATION:  MAJO                         FACILITY:  [facility]   PHYSICIAN:  Hettie Holstein, D.O.    DATE OF BIRTH:  12/27/1910   DATE OF ADMISSION:  04/12/2005  DATE OF DISCHARGE:                                HISTORY & PHYSICAL   PRIMARY CARE PHYSICIAN:  Talmadge Coventry, M.D.   CHIEF COMPLAINT:  Left flank weakness.   HISTORY OF PRESENT ILLNESS:  Tonya Newman is a very active and spry 77-year-  old, independently living, Caucasian female who developed some left leg  weakness around 11 a.m. on June 25.  She states that she was unable to use  it. This happened today on several occasions.  She described this as a heavy  feeling and was unable to move.  She typically is able to walk half a mile  or so a day.  [place] and is quite active, performing all of her  own ADL's.  These symptoms have resolved in the emergency department.  She  denied any other neurologic complaints of symptoms and denies any previous  history of such.   PAST MEDICAL HISTORY:  Significant for kidney stone removal in the 1970's.  Otherwise, she has hypertension, but no other problems.   MEDICATIONS:  1.  Aciphex 20 mg daily.  2.  Synthroid 50 mcg daily.  3.  Lisinopril HCTZ 20/25 daily.   ALLERGIES:  PENICILLIN, ASPIRIN (HOWEVER, ASPIRIN DOES NOT SOUND LIKE A TRUE  ALLERGY, JUST AN INTOLERANCE).   SOCIAL HISTORY:  The patient denies tobacco or alcohol.  She lives alone.  Her husband died in the 54's.  Most of her family lives close by on the same  farm.   FAMILY HISTORY:  Noncontributory.   REVIEW OF SYSTEMS:  Significant for dysuria recently.  Otherwise, no other  complaints.  Further review of systems is unremarkable.   PHYSICAL EXAMINATION:  VITAL SIGNS:  Blood pressure 172/72, temperature  98.7, heart rate 65.  GENERAL APPEARANCE:  The patient is spry, alert, in  no acute distress,  answering questions appropriately.  HEENT:  Normocephalic, atraumatic.  Extraocular muscles intact.  Oropharynx  is clear.  NECK:  Supple, nontender.  CARDIOVASCULAR:  Normal S1, S2 without murmurs, rubs or gallops.  LUNGS:  Clear to auscultation bilaterally with normal effort.  No dullness  to percussion.  ABDOMEN:  Soft, nontender.  EXTREMITIES:  No edema.   LABORATORY DATA:  Urinalysis suggested urinary tract infection.  Sodium 140,  potassium 3.7, BUN 36, creatinine 1.6, glucose 122.  __________ 20/17,  albumin 3.7, WBC 6.1, hemoglobin 9.8, platelets 212,000.  CT scan revealed  meningioma.   ASSESSMENT:  1.  Transient neurological event, resolved.  2.  Poorly controlled hypertension.  3.  Hypothyroidism.  4.  Urinary tract infection.  5.  Renal insufficiency with creatinine of 1.6, uncertain of the baseline.   PLAN:  At this time, I am going to admit Tonya Newman for further evaluation  and management.  Check an MRI, and  in addition to carotid Doppler's and  follow up on her course clinically, and treat her urinary tract infection as  indicated.       ESS/MEDQ  D:  04/13/2005  T:  04/13/2005  Job:  [id number]

## 2011-03-06 NOTE — Discharge Summary (Signed)
NAMEMarland Newman  [name], BRENDLINGER             ACCOUNT NO.:  [id number]   MEDICAL RECORD NO.:  [id number]          PATIENT TYPE:  INP   LOCATION:  4703                         FACILITY:  [facility]   PHYSICIAN:  [name], M.D.    DATE OF BIRTH:  [birth year]   DATE OF ADMISSION:  04/12/2005  DATE OF DISCHARGE:  04/14/2005                                 DISCHARGE SUMMARY   DISCHARGE DIAGNOSES:  1.  Small acute non-hemorrhagic stroke involving the posterior limb of the      internal capsule on the right.  2.  Residual mild left-sided paresis and paresthesias secondary to the acute      stroke.  3.  Hypertension.  4.  Hyperlipidemia.  5.  Urinary tract infection.  6.  Chronic renal disease.  7.  Elevated homocystine level.  8.  Hypothyroidism.  9.  Normocytic anemia.  10. Gastroesophageal reflux disease.   DISCHARGE MEDICATIONS:  1.  Plavix 75 mg daily.  2.  Centrum Silver vitamin one daily.  3.  Folic acid vitamin 1 mg daily.  4.  Over-the-counter iron supplementation once daily.  5.  Lipitor 20 mg half a tablet at bedtime.  6.  Affects 20 mg daily.  7.  Synthroid 50 mcg daily.  8.  Lisinopril/HCTZ 20/25 mg daily.   DISCHARGE DISPOSITION:  The patient was discharged to home in improved and  stable condition on April 14, 2005. The patient was advised to follow up with  her primary care physician, [name] in one week.   PROCEDURES PERFORMED:  1.  MRI of the brain on April 13, 2005. The results revealed a small acute      non-hemorrhagic infarct involving the posterior limb of the right      internal capsule. Mild age related atrophy with mild to moderate small      vessel disease type changes.  2.  MRA of the head. The results revealed mild branch vessel atherosclerotic      type changes.  3.  CT scan of the head on April 12, 2005. The results revealed chronic small      vessel ischemic changes in the white matter. No acute abnormality. Small      right temporal calcified  meningioma.   HISTORY OF PRESENT ILLNESS:  The patient is a very active and spry 77-year-  old independently living lady who presented to the emergency department on  April 12, 2005 with the chief complaint of left leg weakness. The weakness  started on June 25 and was somewhat intermittent since then. At one point,  the patient was unable to use her leg ,however. The patient also had some  mild weakness of her left arm associated with some numbness. The patient is  typically active, and she walks half a mile or so a day. She [place]  farm, and she performs all of her ADLs. Family members live close by, she  has a son who lives next door. The patient complained of no other weakness  or neurological symptoms.   HOSPITAL COURSE:  Problem 1.  SMALL ACUTE RIGHT INTERNAL CAPSULE  STROKE. On  exam in the emergency department, the patient demonstrated no profound  neurological deficits. She was hemodynamically stable on admission; however,  her blood pressure was elevated at 172/72. A number of investigational  studies were ordered to evaluate for TIA and an acute stroke. A MRI of the  brain was ordered and revealed a small acute non-hemorrhagic infarct in the  posterior limb of the right internal capsule. The MRA of the head revealed  no significant stenosis. Her homocystine level was elevated as 17.33. The  fasting lipid panel revealed a total cholesterol of 215, triglycerides of  204, HDL cholesterol of 33, and LDL cholesterol of 141. Her hemoglobin C was  5.7. Her TSH was within normal limits at 1.591.   The patient was started on treatment with Plavix 75 mg daily, folic acid 1  mg daily, and Lipitor 10 mg at bedtime. Her Synthroid was continued at 50  mcg daily. The patient's blood pressure which was initially elevated on  admission, improved to 143/79 prior to hospital discharge. The  lisinopril/HCTZ was resumed. The the patient was evaluated by physical  therapy and occupational  therapy. Per their recommendation, the patient will  be discharged to home with supportive care. The patient was advised to use  her walker or cane for ambulation. During my exam, the patient had a very  mild left upper extremity and left lower extremity weakness, perhaps 5-/5.  The patient was seen ambulating in the hallway without any major deficits.  The therapy team recommended no further outpatient therapy. The patient's  family lives close by, and they will be checking on her on a daily basis.  The patient demonstrated no other neurological deficits during the hospital  course.   Problem 2.  URINARY TRACT INFECTION. The patient's urinalysis was positive  for a moderate amount of leukocytes. The micro urine revealed 7-10 WBCs, 0  to RBCs, and a few bacteria. The patient did have some urinary symptoms on  admission. She was therefore started on ciprofloxacin. The patient will be  discharged to home on Cipro 250 mg b.i.d. for an additional 3 days. The  urine culture was pending at the time of hospital discharge.   Problem 3.  NORMOCYTIC ANEMIA. The patient's hemoglobin ranged between 9.0  and 9.8 during hospital course. Her MCV ranged between 89.3 and 90.1. Iron  studies were ordered and revealed a total iron of 41, TIBC of 241, percent  saturation of 17, ferritin of 103, and RBC folate of 960. The patient will  be treated with an over-the-counter iron supplement, folic acid, and Centrum  Silver vitamin with iron once daily. Given the patient's age, colonoscopy  was not recommended. The patient does take Aciphex chronically for  gastroesophageal reflux disease. The patient had no signs or symptoms  consistent with peptic ulcer disease.   Problem 4.  CHRONIC RENAL INSUFFICIENCY. The patient's BUN and creatinine  were mildly elevated during the hospital course. Her BUN ranged between 30 and 34, and her creatinine ranged between 1.3 and 1.4. Further monitoring  and management by Dr.  Smith Mince. No further workup was warranted during the  hospitalization.   DISCHARGE LABORATORY DATA:  Sodium 142, potassium 3.6, chloride 108, CO2 26,  glucose 109, BUN 30, creatinine 1.4, calcium 9.1. WBC 5.9, hemoglobin 9.8,  hematocrit 28.3, MCV 89.3, platelets 193.       DF/MEDQ  D:  04/16/2005  T:  04/16/2005  Job:  [id number]   cc:   Annmarie Mazzocchi,  M.D.  [address]  [place]  [place] [address]  Fax: [phone]

## 2011-03-06 NOTE — Discharge Summary (Signed)
Tonya Newman, Tonya Newman             ACCOUNT NO.:  [id number]   MEDICAL RECORD NO.:  [id number]          PATIENT TYPE:  OBV   LOCATION:  1306                         FACILITY:  [facility]   PHYSICIAN:  Lonia Blood, M.D.       DATE OF BIRTH:  10-10-1911   DATE OF ADMISSION:  10/05/2006  DATE OF DISCHARGE:  10/06/2006                               DISCHARGE SUMMARY   DISCHARGE DIAGNOSES:  1. Acute bronchitis.  2. Reactive airways.  3. Emphysema.  4. Hypertension.  5. Acute on chronic renal insufficiency.  6. Hypothyroidism.  7. Dehydration resolved.  8. History of a stroke.  9. Hypokalemia secondary to diuretics, resolved.  10.Normocytic anemia.   DISCHARGE MEDICATIONS:  1. Plavix 74 mg daily.  2. Levothyroxine 50 mg daily.  3. Xopenex inhaler 1 puff 3 times a day.  4. Folic acid 1 mg daily.  5. Norvasc 5 mg daily.   CONDITION ON DISCHARGE:  The patient was discharged in good condition.  At the time of discharge, she was instructed to followup with her  primary care physician and to discontinue her lisinopril/HCTZ for now.   CONSULTATIONS:  No consultations were obtained.   PROCEDURE:  The patient underwent a chest x-ray with findings of no  acute disease.   For history and physical, refer to the dictated H&P done by Dr. Corky Downs  October 05, 2006.   HOSPITAL COURSE:  #1.  Acute bronchitis with some mild hyperreactive  airways:  [name] responded well to conservative treatment with  Xopenex nebulizer. She did not require the use of steroids or  antibiotics. Her respiratory status improved significantly and she was  discharged home on Xopenex inhaler.  #2.  Azotemia secondary to ACE inhibitors and hydrochlorothiazide in the  setting of poor p.o. intake:  After oral hydration as well as  intravenous hydration, the patient's basic metabolic profile improved.  At the time of discharge,  the patient's BUN was 51, creatinine 1.9. We will recommend outpatient  followup.  #3.   Anemia:  We have not pursued full evaluation during this hospital  stay as this was a short observation stay. Further outpatient evaluation  as per primary care physician.      Lonia Blood, M.D.  Electronically Signed     SL/MEDQ  D:  10/09/2006  T:  10/11/2006  Job:  [id number]

## 2011-03-06 NOTE — H&P (Signed)
NAMEPIERCE, BIAGINI             ACCOUNT NO.:  [id number]   MEDICAL RECORD NO.:  [id number]          PATIENT TYPE:  EMS   LOCATION:  ED                           FACILITY:  [facility]   PHYSICIAN:  Mobolaji B. Bakare, M.D.DATE OF BIRTH:  [birth year]   DATE OF ADMISSION:  10/05/2006  DATE OF DISCHARGE:                              HISTORY & PHYSICAL   PRIMARY CARE PHYSICIAN:  Mazzochi .   CHIEF COMPLAINT:  Cough and wheezing.   HISTORY OF PRESENTING COMPLAINT:  Ms. Cannady is a pleasant, 77-year-  old, Caucasian female who resides independently at home by herself.  She  has family members living around her.  About 4 days ago, she developed a  sore throat and cough.  The cough is productive of whitish sputum.  There is no associated pleuritic chest pain.  She has not developed  fever.  She used Tylenol without any relief.  She went to urgent care  yesterday and was given some treatment.  The patient, last night, about  3 a.m. woke up and was wheezing and she presented to the emergency room.  On arrival, she was noted to be wheezing.  She has received albuterol  nebs and the patient has received Xopenex and Atrovent nebs.  The  patient is feeling a little bit better but not yet to her baseline.  She  is slightly tachycardic but blood pressure is normal.  Given the  patient's age and has been living independent, [facility] was called to  admit for observation and management.   REVIEW OF SYSTEMS:  She denies nausea, vomiting.  No diarrhea or  constipation.  No precordial chest pain.  There is no headache.  Sore  throat persists.  The patient has some blocked nose, a little trouble  breathing through her nose.   PAST MEDICAL HISTORY:  1. Hypertension.  2. Hypothyroidism.  3. Stroke.   CURRENT MEDICATIONS:  Include:  1. Plavix 75 mg daily.  2. Levothyroxine 50 mcg daily.  3. Lisinopril/hydrochlorothiazide 20/25 one daily.  4. Folic acid daily.  5. Amlodipine 5 mg daily.   ALLERGIES:  1. ASPIRIN.  2. PENICILLIN.   SOCIAL HISTORY:  The patient lives independently and she is a widow.  She does not smoke cigarettes nor drink alcohol.   FAMILY HISTORY:  Not contributory to present illness.   CURRENT VITALS:  Temperature 97.7, blood pressure 142/48, pulse rate of  107, respiratory rate 20, O2 sats of 97% on oxygen.   EXAMINATION:  The patient is comfortable, not dyspneic, normocephalic,  atraumatic.  Mucous membranes moist.  No oral thrush.  No elevated JVD.  No carotid bruits.  LUNGS:  Bilateral expiratory rhonchi, more in the lung bases.  No  crackles.  CV:  S1, S2, systolic murmur.  ABDOMEN:  Not distended, soft, nontender.  Bowel sounds present.  EXTREMITIES:  Bilateral varicose veins.  Tiny telangiectasia.  Dorsalis  pedis pulses palpable bilaterally.  CNS:  No focal neurological deficits.  SKIN:  No rash.  No petechia.   RADIOLOGICAL DATA:  Chest x-ray shows no active cardiopulmonary disease.   LABORATORY DATA:  Sodium  140, potassium 3.3, chloride 104, bicarb 28,  glucose 183, BUN 41, creatinine 1.7, calcium 9.1, and white cell 10.3,  hemoglobin 10.9, hematocrit 31.9, platelets 216.  Urinalysis cloudy in  appearance but specific gravity of 1.006.  Trace leukocyte.  Microscopic  unremarkable.  Strep throat screen negative.  Cardiac markers at the  point of care negative.   ASSESSMENT AND PLAN:  Ms. Hinkley is a 77 year old, Caucasian female  presenting with a sore throat, shortness of breath, wheezing, and a  cough.  No fever.  She has mild leukocytosis.  She has responded  somewhat to Xopenex and Atrovent treatment.  She has been admitted for  24-hour observation.   ADMISSION DIAGNOSES:  1. Acute bronchitis. We will continue Xopenex treatment 0.63 mg q. 6      h. and q. 3 p.r.n.  The patient will need an albuterol inhaler on      discharge.  We will askb10 to use MDI.  2. Mucinex 600 mg b.i.d. and Robitussin p.r.n.  We will given       prednisone 30 mg p.o. daily to taper by 10 mg every 3 days.  We      will assess the patient for pneumococcal and influenza vaccines.  I      do not see any indication for antibiotics at this point.  We will      monitor temperature and symptoms.  3. Azotemia.  The patient is on an angiotensin-converting enzyme      inhibitor and diuretics which she is using for hypertension.  She      may be dehydrated from poor p.o. intake.  We will give intravenous      fluid judiciously and monitor BMET.  4. Hyperglycemia.  The patient does not have history of diabetes      mellitus.  We will check a hemoglobin A1c.  5. Hypokalemia.  We will replete with potassium chloride 40 mEq .  6. Hypertension.  This is fairly controlled.  We will continue on      medication.  7. Hypothyroidism.  We will resume Synthroid.  8. Normocytic anemia.  I am not sure if this has been evaluated in      outpatient setting; however, we will check anemia panel and stool      Hemoccults while here and defer further workup to primary care      physician.      Mobolaji B. Corky Downs, M.D.  Electronically Signed     MBB/MEDQ  D:  10/05/2006  T:  10/05/2006  Job:  [id number]

## 2011-03-18 ENCOUNTER — Other Ambulatory Visit: Payer: Self-pay | Admitting: Nephrology

## 2011-03-18 ENCOUNTER — Encounter (HOSPITAL_COMMUNITY): Payer: Medicare Other | Attending: Nephrology

## 2011-03-18 DIAGNOSIS — D638 Anemia in other chronic diseases classified elsewhere: Secondary | ICD-10-CM | POA: Insufficient documentation

## 2011-03-18 DIAGNOSIS — N184 Chronic kidney disease, stage 4 (severe): Secondary | ICD-10-CM | POA: Insufficient documentation

## 2011-03-18 LAB — RENAL FUNCTION PANEL
Albumin: 3.5 g/dL (ref 3.5–5.2)
BUN: 35 mg/dL — ABNORMAL HIGH (ref 6–23)
Calcium: 9.6 mg/dL (ref 8.4–10.5)
Creatinine, Ser: 1.35 mg/dL — ABNORMAL HIGH (ref 0.4–1.2)
GFR calc Af Amer: 44 mL/min — ABNORMAL LOW (ref >60)
GFR calc non Af Amer: 36 mL/min — ABNORMAL LOW (ref >60)

## 2011-03-18 LAB — POCT HEMOGLOBIN-HEMACUE: Hemoglobin: 11.8 g/dL — ABNORMAL LOW (ref 12.0–15.0)

## 2011-03-18 LAB — IRON AND TIBC: UIBC: 182 ug/dL

## 2011-04-29 ENCOUNTER — Encounter (HOSPITAL_COMMUNITY): Payer: Medicare Other | Attending: Nephrology

## 2011-04-29 ENCOUNTER — Other Ambulatory Visit: Payer: Self-pay | Admitting: Nephrology

## 2011-04-29 DIAGNOSIS — N184 Chronic kidney disease, stage 4 (severe): Secondary | ICD-10-CM | POA: Insufficient documentation

## 2011-04-29 DIAGNOSIS — D638 Anemia in other chronic diseases classified elsewhere: Secondary | ICD-10-CM | POA: Insufficient documentation

## 2011-04-29 LAB — RENAL FUNCTION PANEL
Albumin: 3.5 g/dL (ref 3.5–5.2)
BUN: 33 mg/dL — ABNORMAL HIGH (ref 6–23)
Chloride: 105 mEq/L (ref 96–112)
GFR calc Af Amer: 42 mL/min — ABNORMAL LOW (ref >60)
Phosphorus: 3.2 mg/dL (ref 2.3–4.6)
Potassium: 4.1 mEq/L (ref 3.5–5.1)
Sodium: 140 mEq/L (ref 135–145)

## 2011-04-29 LAB — IRON AND TIBC
Iron: 83 ug/dL (ref 42–135)
UIBC: 183 ug/dL

## 2011-04-29 LAB — FERRITIN: Ferritin: 132 ng/mL (ref 10–291)

## 2011-06-10 ENCOUNTER — Encounter (HOSPITAL_COMMUNITY): Payer: Medicare Other | Attending: Nephrology

## 2011-06-10 ENCOUNTER — Other Ambulatory Visit: Payer: Self-pay | Admitting: Nephrology

## 2011-06-10 DIAGNOSIS — N184 Chronic kidney disease, stage 4 (severe): Secondary | ICD-10-CM | POA: Insufficient documentation

## 2011-06-10 DIAGNOSIS — D638 Anemia in other chronic diseases classified elsewhere: Secondary | ICD-10-CM | POA: Insufficient documentation

## 2011-06-10 LAB — IRON AND TIBC
Iron: 55 ug/dL (ref 42–135)
Saturation Ratios: 21 % (ref 20–55)
TIBC: 259 ug/dL (ref 250–470)
UIBC: 204 ug/dL

## 2011-06-10 LAB — RENAL FUNCTION PANEL
Albumin: 3.5 g/dL (ref 3.5–5.2)
BUN: 30 mg/dL — ABNORMAL HIGH (ref 6–23)
CO2: 28 mEq/L (ref 19–32)
Chloride: 106 mEq/L (ref 96–112)
Creatinine, Ser: 1.45 mg/dL — ABNORMAL HIGH (ref 0.50–1.10)
Glucose, Bld: 110 mg/dL — ABNORMAL HIGH (ref 70–99)
Potassium: 4.4 mEq/L (ref 3.5–5.1)

## 2011-06-10 LAB — POCT HEMOGLOBIN-HEMACUE: Hemoglobin: 11.3 g/dL — ABNORMAL LOW (ref 12.0–15.0)

## 2011-07-08 LAB — IRON AND TIBC
Iron: 77
Saturation Ratios: 28
TIBC: 274
UIBC: 197

## 2011-07-08 LAB — RENAL FUNCTION PANEL
BUN: 34 — ABNORMAL HIGH
CO2: 29
Calcium: 9.1
Glucose, Bld: 101 — ABNORMAL HIGH
Phosphorus: 3.6
Sodium: 139

## 2011-07-08 LAB — CBC
HCT: 32.7 — ABNORMAL LOW
Platelets: 211
RDW: 13.5
WBC: 5.9

## 2011-07-10 LAB — CBC
HCT: 32.3 — ABNORMAL LOW
MCHC: 34.4
MCV: 93.4
Platelets: 196
WBC: 6.4

## 2011-07-10 LAB — RENAL FUNCTION PANEL
BUN: 25 — ABNORMAL HIGH
CO2: 28
Chloride: 106
Glucose, Bld: 95
Potassium: 4.3

## 2011-07-10 LAB — FERRITIN: Ferritin: 101 (ref 10–291)

## 2011-07-10 LAB — IRON AND TIBC: UIBC: 203

## 2011-07-13 LAB — CBC
MCHC: 35.2
MCV: 91.2
Platelets: 160
RBC: 3.66 — ABNORMAL LOW
WBC: 4.1

## 2011-07-13 LAB — BASIC METABOLIC PANEL
BUN: 34 — ABNORMAL HIGH
CO2: 27
Calcium: 8.8
Creatinine, Ser: 1.8 — ABNORMAL HIGH
GFR calc Af Amer: 32 — ABNORMAL LOW

## 2011-07-13 LAB — POCT HEMOGLOBIN-HEMACUE
Hemoglobin: 9.8 — ABNORMAL LOW
Operator id: 206361

## 2011-07-13 LAB — URINALYSIS, ROUTINE W REFLEX MICROSCOPIC
Nitrite: NEGATIVE
Specific Gravity, Urine: 1.018
Urobilinogen, UA: 0.2
pH: 6

## 2011-07-13 LAB — URINE MICROSCOPIC-ADD ON

## 2011-07-13 LAB — URINE CULTURE

## 2011-07-14 LAB — DIFFERENTIAL
Basophils Absolute: 0
Basophils Relative: 0
Eosinophils Absolute: 0.3
Eosinophils Relative: 2
Lymphocytes Relative: 8 — ABNORMAL LOW
Monocytes Absolute: 1.9 — ABNORMAL HIGH

## 2011-07-14 LAB — POCT I-STAT, CHEM 8
BUN: 35 — ABNORMAL HIGH
Calcium, Ion: 1.16
HCT: 32 — ABNORMAL LOW
Hemoglobin: 10.9 — ABNORMAL LOW
Sodium: 141
TCO2: 27

## 2011-07-14 LAB — CBC
HCT: 41.2
Hemoglobin: 12.6
MCHC: 30.7
MCV: 80.6
Platelets: 239
RDW: 22.2 — ABNORMAL HIGH

## 2011-07-14 LAB — URINALYSIS, ROUTINE W REFLEX MICROSCOPIC
Glucose, UA: NEGATIVE
Ketones, ur: NEGATIVE
Protein, ur: NEGATIVE
Urobilinogen, UA: 0.2

## 2011-07-14 LAB — POCT CARDIAC MARKERS
Operator id: 234501
Troponin i, poc: <0.05

## 2011-07-16 LAB — IRON AND TIBC: Iron: 79

## 2011-07-16 LAB — CBC
HCT: 34.6 — ABNORMAL LOW
HCT: 38
Hemoglobin: 11.6 — ABNORMAL LOW
Hemoglobin: 12.7
MCHC: 33.4
MCV: 95
Platelets: 191
RDW: 15.4
RDW: 14.3

## 2011-07-16 LAB — RENAL FUNCTION PANEL
BUN: 24 — ABNORMAL HIGH
CO2: 27
Glucose, Bld: 98
Potassium: 4.2
Sodium: 141

## 2011-07-16 LAB — DIFFERENTIAL
Basophils Absolute: 0
Basophils Relative: 0
Eosinophils Relative: 2
Monocytes Absolute: 0.5
Neutro Abs: 3.7

## 2011-07-21 LAB — IRON AND TIBC
Iron: 101
Saturation Ratios: 38
TIBC: 267
UIBC: 166

## 2011-07-21 LAB — FERRITIN: Ferritin: 82 (ref 10–291)

## 2011-07-21 LAB — RENAL FUNCTION PANEL
BUN: 29 — ABNORMAL HIGH
Calcium: 9
Creatinine, Ser: 1.45 — ABNORMAL HIGH
Glucose, Bld: 103 — ABNORMAL HIGH
Phosphorus: 3.8
Potassium: 4.7

## 2011-07-22 ENCOUNTER — Encounter (HOSPITAL_COMMUNITY)
Admission: RE | Admit: 2011-07-22 | Discharge: 2011-07-22 | Disposition: A | Discharge status: Home / Self Care | Payer: Medicare Other | Source: Ambulatory Visit | Attending: Nephrology | Admitting: Nephrology

## 2011-07-22 ENCOUNTER — Other Ambulatory Visit: Payer: Self-pay | Admitting: Nephrology

## 2011-07-22 DIAGNOSIS — D638 Anemia in other chronic diseases classified elsewhere: Secondary | ICD-10-CM | POA: Insufficient documentation

## 2011-07-22 DIAGNOSIS — N184 Chronic kidney disease, stage 4 (severe): Secondary | ICD-10-CM | POA: Insufficient documentation

## 2011-07-22 LAB — RENAL FUNCTION PANEL
Albumin: 3.5
BUN: 32 mg/dL — ABNORMAL HIGH (ref 6–23)
CO2: 27 mEq/L (ref 19–32)
Calcium: 9 mg/dL (ref 8.4–10.5)
GFR calc Af Amer: 36 mL/min — ABNORMAL LOW (ref >90)
Glucose, Bld: 133 mg/dL — ABNORMAL HIGH (ref 70–99)
Glucose, Bld: 97
Phosphorus: 3.8
Potassium: 4.5
Sodium: 140
Sodium: 143 mEq/L (ref 135–145)

## 2011-07-22 LAB — IRON AND TIBC
Iron: 75 ug/dL (ref 42–135)
TIBC: 249 ug/dL — ABNORMAL LOW (ref 250–470)

## 2011-07-22 LAB — FERRITIN: Ferritin: 153 ng/mL (ref 10–291)

## 2011-07-24 LAB — RENAL FUNCTION PANEL
Albumin: 3.6 g/dL (ref 3.5–5.2)
BUN: 25 mg/dL — ABNORMAL HIGH (ref 6–23)
Chloride: 107 mEq/L (ref 96–112)
Phosphorus: 3.5 mg/dL (ref 2.3–4.6)
Potassium: 4.4 mEq/L (ref 3.5–5.1)
Sodium: 144 mEq/L (ref 135–145)

## 2011-07-24 LAB — IRON AND TIBC
Saturation Ratios: 32 % (ref 20–55)
TIBC: 292 ug/dL (ref 250–470)

## 2011-07-24 LAB — POCT HEMOGLOBIN-HEMACUE: Hemoglobin: 11.6 g/dL — ABNORMAL LOW (ref 12.0–15.0)

## 2011-07-24 LAB — FERRITIN: Ferritin: 60 ng/mL (ref 10–291)

## 2011-07-30 LAB — CBC
Hemoglobin: 11.3 — ABNORMAL LOW
MCHC: 34.2
Platelets: 200
Platelets: 230
RBC: 3.43 — ABNORMAL LOW
RDW: 13.8
WBC: 6

## 2011-07-30 LAB — DIFFERENTIAL
Basophils Absolute: 0.1
Basophils Relative: 1
Monocytes Absolute: 0.5
Neutro Abs: 3.1

## 2011-07-30 LAB — URINALYSIS, ROUTINE W REFLEX MICROSCOPIC
Bilirubin Urine: NEGATIVE
Hgb urine dipstick: NEGATIVE
Ketones, ur: NEGATIVE
Nitrite: NEGATIVE
Protein, ur: NEGATIVE
Urobilinogen, UA: 0.2

## 2011-07-30 LAB — IRON AND TIBC
Iron: 91
Saturation Ratios: 35
TIBC: 259
UIBC: 168

## 2011-07-30 LAB — BASIC METABOLIC PANEL
BUN: 28 — ABNORMAL HIGH
CO2: 27
Calcium: 9.1
Creatinine, Ser: 1.94 — ABNORMAL HIGH
Glucose, Bld: 118 — ABNORMAL HIGH
Sodium: 141

## 2011-07-30 LAB — FERRITIN: Ferritin: 91 (ref 10–291)

## 2011-07-31 LAB — CBC
Hemoglobin: 11.2 — ABNORMAL LOW
Platelets: 220
RDW: 13.7
WBC: 5.6

## 2011-08-03 LAB — IRON AND TIBC
Saturation Ratios: 33
TIBC: 252
UIBC: 168

## 2011-08-04 LAB — DIFFERENTIAL
Basophils Absolute: 0
Basophils Relative: 1
Eosinophils Absolute: 0.1
Eosinophils Relative: 1
Monocytes Absolute: 0.4

## 2011-08-04 LAB — CBC
HCT: 34.7 — ABNORMAL LOW
Hemoglobin: 11.7 — ABNORMAL LOW
MCHC: 33.7
RDW: 14

## 2011-08-06 LAB — IRON AND TIBC
Iron: 85
Saturation Ratios: 31
TIBC: 270

## 2011-08-06 LAB — CBC
Platelets: 188
RDW: 13

## 2011-08-18 ENCOUNTER — Other Ambulatory Visit (HOSPITAL_COMMUNITY): Payer: Self-pay | Admitting: *Deleted

## 2011-08-31 ENCOUNTER — Other Ambulatory Visit (HOSPITAL_COMMUNITY): Payer: Self-pay | Admitting: *Deleted

## 2011-09-02 ENCOUNTER — Encounter (HOSPITAL_COMMUNITY)
Admission: RE | Admit: 2011-09-02 | Discharge: 2011-09-02 | Disposition: A | Discharge status: Home / Self Care | Payer: Medicare Other | Source: Ambulatory Visit | Attending: Nephrology | Admitting: Nephrology

## 2011-09-02 DIAGNOSIS — D638 Anemia in other chronic diseases classified elsewhere: Secondary | ICD-10-CM | POA: Insufficient documentation

## 2011-09-02 DIAGNOSIS — N184 Chronic kidney disease, stage 4 (severe): Secondary | ICD-10-CM | POA: Insufficient documentation

## 2011-09-02 LAB — FERRITIN: Ferritin: 137 ng/mL (ref 10–291)

## 2011-09-02 LAB — RENAL FUNCTION PANEL
Albumin: 3.4 g/dL — ABNORMAL LOW (ref 3.5–5.2)
BUN: 38 mg/dL — ABNORMAL HIGH (ref 6–23)
Creatinine, Ser: 1.52 mg/dL — ABNORMAL HIGH (ref 0.50–1.10)
Phosphorus: 3.6 mg/dL (ref 2.3–4.6)
Potassium: 4 mEq/L (ref 3.5–5.1)

## 2011-09-02 LAB — POCT HEMOGLOBIN-HEMACUE: Hemoglobin: 11.4 g/dL — ABNORMAL LOW (ref 12.0–15.0)

## 2011-09-02 MED ORDER — DARBEPOETIN ALFA-POLYSORBATE 100 MCG/0.5ML IJ SOLN
INTRAMUSCULAR | Status: AC
  Filled 2011-09-02: qty 0.5

## 2011-09-02 MED ORDER — DARBEPOETIN ALFA-POLYSORBATE 500 MCG/ML IJ SOLN
100.0000 ug | INTRAMUSCULAR | Status: AC
  Administered 2011-09-02: 100 ug via SUBCUTANEOUS
  Filled 2011-09-02: qty 1

## 2011-09-18 ENCOUNTER — Other Ambulatory Visit: Payer: Self-pay | Admitting: Nephrology

## 2011-09-28 MED FILL — Darbepoetin Alfa-Polysorbate 80 Soln Inj 100 MCG/0.5ML: INTRAMUSCULAR | Qty: 0.5 | Status: AC

## 2011-10-08 ENCOUNTER — Other Ambulatory Visit (HOSPITAL_COMMUNITY): Payer: Self-pay | Admitting: *Deleted

## 2011-10-15 ENCOUNTER — Encounter (HOSPITAL_COMMUNITY): Payer: Medicare Other

## 2011-10-23 ENCOUNTER — Encounter (HOSPITAL_COMMUNITY)
Admission: RE | Admit: 2011-10-23 | Discharge: 2011-10-23 | Disposition: A | Discharge status: Home / Self Care | Payer: Medicare Other | Source: Ambulatory Visit | Attending: Nephrology | Admitting: Nephrology

## 2011-10-23 DIAGNOSIS — N184 Chronic kidney disease, stage 4 (severe): Secondary | ICD-10-CM | POA: Insufficient documentation

## 2011-10-23 DIAGNOSIS — D638 Anemia in other chronic diseases classified elsewhere: Secondary | ICD-10-CM | POA: Insufficient documentation

## 2011-10-23 LAB — IRON AND TIBC
Saturation Ratios: 22 % (ref 20–55)
TIBC: 259 ug/dL (ref 250–470)

## 2011-10-23 MED ORDER — DARBEPOETIN ALFA-POLYSORBATE 100 MCG/0.5ML IJ SOLN
INTRAMUSCULAR | Status: AC
  Administered 2011-10-23: 100 ug via SUBCUTANEOUS
  Filled 2011-10-23: qty 0.5

## 2011-10-23 MED ORDER — DARBEPOETIN ALFA-POLYSORBATE 500 MCG/ML IJ SOLN
100.0000 ug | INTRAMUSCULAR | Status: DC
  Filled 2011-10-23: qty 1

## 2011-10-30 ENCOUNTER — Emergency Department (HOSPITAL_BASED_OUTPATIENT_CLINIC_OR_DEPARTMENT_OTHER)
Admission: EM | Admit: 2011-10-30 | Discharge: 2011-10-30 | Disposition: A | Discharge status: Home / Self Care | Payer: Medicare Other | Attending: Emergency Medicine | Admitting: Emergency Medicine

## 2011-10-30 ENCOUNTER — Other Ambulatory Visit: Payer: Self-pay

## 2011-10-30 ENCOUNTER — Encounter (HOSPITAL_BASED_OUTPATIENT_CLINIC_OR_DEPARTMENT_OTHER): Payer: Self-pay

## 2011-10-30 DIAGNOSIS — R209 Unspecified disturbances of skin sensation: Secondary | ICD-10-CM | POA: Insufficient documentation

## 2011-10-30 DIAGNOSIS — I10 Essential (primary) hypertension: Secondary | ICD-10-CM | POA: Insufficient documentation

## 2011-10-30 DIAGNOSIS — H9190 Unspecified hearing loss, unspecified ear: Secondary | ICD-10-CM | POA: Insufficient documentation

## 2011-10-30 DIAGNOSIS — R200 Anesthesia of skin: Secondary | ICD-10-CM

## 2011-10-30 DIAGNOSIS — E079 Disorder of thyroid, unspecified: Secondary | ICD-10-CM | POA: Insufficient documentation

## 2011-10-30 HISTORY — DX: Essential (primary) hypertension: I10

## 2011-10-30 HISTORY — DX: Disorder of thyroid, unspecified: E07.9

## 2011-10-30 LAB — COMPREHENSIVE METABOLIC PANEL
AST: 16 U/L (ref 0–37)
BUN: 30 mg/dL — ABNORMAL HIGH (ref 6–23)
CO2: 27 mEq/L (ref 19–32)
Calcium: 10 mg/dL (ref 8.4–10.5)
Creatinine, Ser: 1.5 mg/dL — ABNORMAL HIGH (ref 0.50–1.10)
GFR calc non Af Amer: 28 mL/min — ABNORMAL LOW (ref >90)

## 2011-10-30 LAB — DIFFERENTIAL
Basophils Absolute: 0 10*3/uL (ref 0.0–0.1)
Basophils Relative: 0 % (ref 0–1)
Eosinophils Relative: 1 % (ref 0–5)
Lymphocytes Relative: 23 % (ref 12–46)
Monocytes Absolute: 0.7 10*3/uL (ref 0.1–1.0)

## 2011-10-30 LAB — URINALYSIS, ROUTINE W REFLEX MICROSCOPIC
Hgb urine dipstick: NEGATIVE
Leukocytes, UA: NEGATIVE
Nitrite: NEGATIVE
Specific Gravity, Urine: 1.008 (ref 1.005–1.030)
Urobilinogen, UA: 0.2 mg/dL (ref 0.0–1.0)

## 2011-10-30 LAB — URINE CULTURE: Culture: NO GROWTH

## 2011-10-30 LAB — CBC
HCT: 34.2 % — ABNORMAL LOW (ref 36.0–46.0)
MCHC: 32.7 g/dL (ref 30.0–36.0)
MCV: 93.4 fL (ref 78.0–100.0)
RDW: 13.4 % (ref 11.5–15.5)

## 2011-10-30 MED ORDER — SODIUM CHLORIDE 0.9 % IV SOLN
INTRAVENOUS | Status: DC
  Administered 2011-10-30: 19:00:00 via INTRAVENOUS

## 2011-10-30 NOTE — ED Notes (Signed)
Pt c/o numbness in bilateral feet after walking this afternoon.

## 2011-10-30 NOTE — ED Notes (Signed)
Assisted pt to bathroom.  No difficulty with ambulation.  Son at bedside and reports pt has been complaining that her feet are "hot".

## 2011-10-30 NOTE — ED Notes (Signed)
EMS reports pt was walking on her front porch and c/o bilateral foot numbness.  Pt states she burned her feet, however no visible burns seen on feet.  Pt denies any pain and is unable to recall why she is in the ER.

## 2011-10-30 NOTE — ED Provider Notes (Addendum)
History     CSN: [id number]  Arrival date & time 10/30/11  1749   First MD Initiated Contact with Patient 10/30/11 1823      Chief Complaint  Patient presents with  . Numbness    (Consider location/radiation/quality/duration/timing/severity/associated sxs/prior treatment) HPI Comments: Patient is a 78 year old woman who is grown home during the day and with her son at night. She felt numbness in her feet, and called EMS. They brought her to [facility] ED for evaluation. They noted that her blood pressure was high this evening. Her son gives her history. She takes medication for hypertension and hypothyroidism. She gets an injection for anemia of every 6 weeks. She is very deaf, and is unable to participate and review of systems as a result of this.  Patient is a 78 y.o. female presenting with general illness. The history is provided by a relative and medical records. No language interpreter was used.  Illness  The current episode started today. The onset was sudden. The problem occurs rarely. The problem has been unchanged. The problem is moderate. The symptoms are relieved by nothing. The symptoms are aggravated by nothing (There was no new illness and no new injury.). Pertinent negatives include no fever, no diarrhea, no nausea, no vomiting, no muscle aches, no neck pain, no cough, no URI and no rash. Associated symptoms comments: She is quite deaf.. She has been behaving normally. She has received no recent medical care (She was hospitalized a year ago for generalized weakness. At that time she had renal failure that resolved with intravenous fluids.).    Past Medical History  Diagnosis Date  . Hypertension   . Thyroid disease     No past surgical history on file.  No family history on file.  History  Substance Use Topics  . Smoking status: Never Smoker   . Smokeless tobacco: Never Used  . Alcohol Use: No    OB History    Grav Para Term Preterm Abortions TAB SAB  Ect Mult Living                  Review of Systems  Unable to perform ROS: Other  Constitutional: Negative for fever.  HENT: Negative for neck pain.   Respiratory: Negative for cough.   Gastrointestinal: Negative for nausea, vomiting and diarrhea.  Skin: Negative for rash.    Allergies  Aspirin  Home Medications   Current Outpatient Rx  Name Route Sig Dispense Refill  . LEVOTHYROXINE SODIUM 100 MCG PO TABS Oral Take 50 mcg by mouth daily.     Marland Kitchen LISINOPRIL 10 MG PO TABS Oral Take by mouth daily.      BP 194/85  Pulse 85  Temp(Src) 97.8 F (36.6 C) (Oral)  Resp 16  Wt 130 lb (58.968 kg)  SpO2 98%  Physical Exam  Nursing note and vitals reviewed. Constitutional: She is oriented to person, place, and time.       Slender elderly woman, no distress at rest. She is extremely deaf.  HENT:  Head: Normocephalic and atraumatic.  Right Ear: External ear normal.  Left Ear: External ear normal.  Mouth/Throat: Oropharynx is clear and moist.  Eyes: Conjunctivae and EOM are normal. Pupils are equal, round, and reactive to light. No scleral icterus.  Neck: Normal range of motion. Neck supple.       No carotid bruit.  Cardiovascular: Normal rate, regular rhythm and normal heart sounds.   Pulmonary/Chest: Effort normal. No respiratory distress. She has  wheezes. She has no rales.  Abdominal: Soft. Bowel sounds are normal.  Musculoskeletal: Normal range of motion.  Neurological: She is alert and oriented to person, place, and time.       She complains of numbness in the feet, but can feel touch, and moves her legs and feet normally.  Skin: Skin is warm and dry.  Psychiatric: She has a normal mood and affect. Her behavior is normal.    ED Course  Procedures (including critical care time)   Labs Reviewed  CBC  DIFFERENTIAL  COMPREHENSIVE METABOLIC PANEL  URINALYSIS, ROUTINE W REFLEX MICROSCOPIC  URINE CULTURE   6:44 PM] Patient was seen and had physical examination. Old  charts were reviewed. Laboratory testing was ordered.  6:47 PM  Date: 10/30/2011  Rate: 75  Rhythm: normal sinus rhythm  QRS Axis: normal  Intervals: normal  ST/T Wave abnormalities: normal  Conduction Disutrbances:none  Narrative Interpretation: Normal EKG  Old EKG Reviewed: unchanged  Results for orders placed during the hospital encounter of 10/30/11  CBC      Component Value Range   WBC 6.7  4.0 - 10.5 (K/uL)   RBC 3.66 (*) 3.87 - 5.11 (MIL/uL)   Hemoglobin 11.2 (*) 12.0 - 15.0 (g/dL)   HCT 16.1 (*) 09.6 - 46.0 (%)   MCV 93.4  78.0 - 100.0 (fL)   MCH 30.6  26.0 - 34.0 (pg)   MCHC 32.7  30.0 - 36.0 (g/dL)   RDW 04.5  40.9 - 81.1 (%)   Platelets 202  150 - 400 (K/uL)  DIFFERENTIAL      Component Value Range   Neutrophils Relative 66  43 - 77 (%)   Neutro Abs 4.4  1.7 - 7.7 (K/uL)   Lymphocytes Relative 23  12 - 46 (%)   Lymphs Abs 1.6  0.7 - 4.0 (K/uL)   Monocytes Relative 10  3 - 12 (%)   Monocytes Absolute 0.7  0.1 - 1.0 (K/uL)   Eosinophils Relative 1  0 - 5 (%)   Eosinophils Absolute 0.1  0.0 - 0.7 (K/uL)   Basophils Relative 0  0 - 1 (%)   Basophils Absolute 0.0  0.0 - 0.1 (K/uL)  COMPREHENSIVE METABOLIC PANEL      Component Value Range   Sodium 144  135 - 145 (mEq/L)   Potassium 3.9  3.5 - 5.1 (mEq/L)   Chloride 106  96 - 112 (mEq/L)   CO2 27  19 - 32 (mEq/L)   Glucose, Bld 114 (*) 70 - 99 (mg/dL)   BUN 30 (*) 6 - 23 (mg/dL)   Creatinine, Ser 9.14 (*) 0.50 - 1.10 (mg/dL)   Calcium 78.2  8.4 - 10.5 (mg/dL)   Total Protein 6.5  6.0 - 8.3 (g/dL)   Albumin 3.7  3.5 - 5.2 (g/dL)   AST 16  0 - 37 (U/L)   ALT 11  0 - 35 (U/L)   Alkaline Phosphatase 66  39 - 117 (U/L)   Total Bilirubin 0.3  0.3 - 1.2 (mg/dL)   GFR calc non Af Amer 28 (*) >90 (mL/min)   GFR calc Af Amer 32 (*) >90 (mL/min)  URINALYSIS, ROUTINE W REFLEX MICROSCOPIC      Component Value Range   Color, Urine YELLOW  YELLOW    APPearance CLEAR  CLEAR    Specific Gravity, Urine 1.008  1.005 -  1.030    pH 6.5  5.0 - 8.0    Glucose, UA NEGATIVE  NEGATIVE (mg/dL)  Hgb urine dipstick NEGATIVE  NEGATIVE    Bilirubin Urine NEGATIVE  NEGATIVE    Ketones, ur NEGATIVE  NEGATIVE (mg/dL)   Protein, ur NEGATIVE  NEGATIVE (mg/dL)   Urobilinogen, UA 0.2  0.0 - 1.0 (mg/dL)   Nitrite NEGATIVE  NEGATIVE    Leukocytes, UA NEGATIVE  NEGATIVE    8:12 PM Lab workup is reassuring. Patient feels better, no longer numb in her feet. Will give her approximately 500 cc of normal saline, and then released home.     1. Numbness of feet   2. Hypertension              Carleene Cooper III, MD 10/30/11 1844  Carleene Cooper III, MD 10/30/11 2016

## 2011-11-20 ENCOUNTER — Other Ambulatory Visit: Payer: Self-pay

## 2011-11-20 ENCOUNTER — Emergency Department (HOSPITAL_COMMUNITY): Payer: Medicare Other

## 2011-11-20 ENCOUNTER — Encounter (HOSPITAL_COMMUNITY): Payer: Self-pay | Admitting: *Deleted

## 2011-11-20 ENCOUNTER — Emergency Department (HOSPITAL_COMMUNITY)
Admission: EM | Admit: 2011-11-20 | Discharge: 2011-11-21 | Disposition: A | Discharge status: Home / Self Care | Payer: Medicare Other | Attending: Emergency Medicine | Admitting: Emergency Medicine

## 2011-11-20 DIAGNOSIS — E039 Hypothyroidism, unspecified: Secondary | ICD-10-CM | POA: Insufficient documentation

## 2011-11-20 DIAGNOSIS — F603 Borderline personality disorder: Secondary | ICD-10-CM | POA: Insufficient documentation

## 2011-11-20 DIAGNOSIS — F0390 Unspecified dementia without behavioral disturbance: Secondary | ICD-10-CM | POA: Insufficient documentation

## 2011-11-20 DIAGNOSIS — M545 Low back pain, unspecified: Secondary | ICD-10-CM | POA: Insufficient documentation

## 2011-11-20 DIAGNOSIS — F29 Unspecified psychosis not due to a substance or known physiological condition: Secondary | ICD-10-CM | POA: Insufficient documentation

## 2011-11-20 DIAGNOSIS — R4689 Other symptoms and signs involving appearance and behavior: Secondary | ICD-10-CM

## 2011-11-20 DIAGNOSIS — I10 Essential (primary) hypertension: Secondary | ICD-10-CM | POA: Insufficient documentation

## 2011-11-20 DIAGNOSIS — I872 Venous insufficiency (chronic) (peripheral): Secondary | ICD-10-CM | POA: Insufficient documentation

## 2011-11-20 LAB — COMPREHENSIVE METABOLIC PANEL
Alkaline Phosphatase: 72 U/L (ref 39–117)
BUN: 34 mg/dL — ABNORMAL HIGH (ref 6–23)
Chloride: 105 mEq/L (ref 96–112)
GFR calc Af Amer: 31 mL/min — ABNORMAL LOW (ref >90)
GFR calc non Af Amer: 27 mL/min — ABNORMAL LOW (ref >90)
Glucose, Bld: 127 mg/dL — ABNORMAL HIGH (ref 70–99)
Potassium: 4.2 mEq/L (ref 3.5–5.1)
Total Bilirubin: 0.2 mg/dL — ABNORMAL LOW (ref 0.3–1.2)

## 2011-11-20 LAB — RAPID URINE DRUG SCREEN, HOSP PERFORMED
Barbiturates: NOT DETECTED
Cocaine: NOT DETECTED
Tetrahydrocannabinol: NOT DETECTED

## 2011-11-20 LAB — DIFFERENTIAL
Eosinophils Absolute: 0.1 10*3/uL (ref 0.0–0.7)
Lymphs Abs: 2 10*3/uL (ref 0.7–4.0)
Monocytes Absolute: 0.7 10*3/uL (ref 0.1–1.0)
Monocytes Relative: 8 % (ref 3–12)
Neutro Abs: 5.1 10*3/uL (ref 1.7–7.7)
Neutrophils Relative %: 65 % (ref 43–77)

## 2011-11-20 LAB — URINALYSIS, MICROSCOPIC ONLY
Ketones, ur: NEGATIVE mg/dL
Leukocytes, UA: NEGATIVE
Nitrite: NEGATIVE
Protein, ur: NEGATIVE mg/dL
Urobilinogen, UA: 0.2 mg/dL (ref 0.0–1.0)

## 2011-11-20 LAB — CBC
HCT: 35.4 % — ABNORMAL LOW (ref 36.0–46.0)
Hemoglobin: 11.9 g/dL — ABNORMAL LOW (ref 12.0–15.0)
MCH: 31.1 pg (ref 26.0–34.0)
RBC: 3.83 MIL/uL — ABNORMAL LOW (ref 3.87–5.11)

## 2011-11-20 MED ORDER — ACETAMINOPHEN 325 MG PO TABS
650.0000 mg | ORAL_TABLET | ORAL | Status: DC | PRN
  Administered 2011-11-21: 650 mg via ORAL
  Filled 2011-11-20: qty 2

## 2011-11-20 MED ORDER — ALUM & MAG HYDROXIDE-SIMETH 200-200-20 MG/5ML PO SUSP: 30.0000 mL | ORAL | Status: DC | PRN

## 2011-11-20 MED ORDER — ZOLPIDEM TARTRATE 5 MG PO TABS: 5.0000 mg | ORAL_TABLET | Freq: Every evening | ORAL | Status: DC | PRN

## 2011-11-20 MED ORDER — LORAZEPAM 1 MG PO TABS: 1.0000 mg | ORAL_TABLET | Freq: Three times a day (TID) | ORAL | Status: DC | PRN

## 2011-11-20 MED ORDER — ONDANSETRON HCL 4 MG PO TABS: 4.0000 mg | ORAL_TABLET | Freq: Three times a day (TID) | ORAL | Status: DC | PRN

## 2011-11-20 MED ORDER — LORAZEPAM 1 MG PO TABS
1.0000 mg | ORAL_TABLET | Freq: Once | ORAL | Status: AC
Start: 2011-11-20 — End: 2011-11-20
  Administered 2011-11-20: 1 mg via ORAL
  Filled 2011-11-20: qty 1

## 2011-11-20 NOTE — ED Provider Notes (Addendum)
78 year old female is brought in by family under involuntary commitment because of inability to control behavior at home. She will get aggressive towards family members. She also has wandered out of the house and [place] at [place]. The patient's only complaint is numbness and burning in her feet. Her exam is significant only for not being oriented to place or time and also for evidence of chronic venous stasis changes in her legs. Feet have strong pulses and prompt capillary refill. She likely will need to be admitted to dementia unit.  ECG shows normal sinus rhythm with a rate of 73, no ectopy. Normal axis. Normal P wave. Normal QRS. Normal intervals. Normal ST and T waves. Impression: normal ECG. When compared with ECG of 10/30/2011, no significant changes are seen.     Dione Booze, MD 11/20/11 1513  Dione Booze, MD 11/20/11 [phone]

## 2011-11-20 NOTE — ED Notes (Signed)
Pt returned from xray, family at bedside, pt resting comfortably in bed.

## 2011-11-20 NOTE — ED Notes (Signed)
Patient up and at door questioning where she is at. Returned to bed with assist of 1

## 2011-11-20 NOTE — ED Provider Notes (Signed)
History     CSN: [id number]  Arrival date & time 11/20/11  1357   First MD Initiated Contact with Patient 11/20/11 1502      Chief Complaint  Patient presents with  . Dementia  . Medical Clearance    (Consider location/radiation/quality/duration/timing/severity/associated sxs/prior treatment) HPI Comments: Patient brought to the emergency department for evaluation of aggressive behavior. Patient has had aggressive behavior for the past one year. 2 daughters are present at bedside and state that the patient will act out and become abusive. She also goes outside at nighttime along the roadway without supervision. They have not spoken with PCP about long-term care and patient is living with children. Patient is under IVC orders. Patient's only medical complaint is sensation of burning in feet.   The history is provided by the patient and a relative.    Past Medical History  Diagnosis Date  . Hypertension   . Thyroid disease     History reviewed. No pertinent past surgical history.  No family history on file.  History  Substance Use Topics  . Smoking status: Never Smoker   . Smokeless tobacco: Never Used  . Alcohol Use: No    OB History    Grav Para Term Preterm Abortions TAB SAB Ect Mult Living                  Review of Systems  Constitutional: Negative for fever.  HENT: Negative for sore throat and rhinorrhea.   Eyes: Negative for redness.  Respiratory: Negative for cough.   Cardiovascular: Negative for chest pain.  Gastrointestinal: Negative for nausea, vomiting, abdominal pain and diarrhea.  Genitourinary: Negative for dysuria.  Musculoskeletal: Negative for myalgias.  Skin: Negative for rash.  Neurological: Negative for headaches.       + for burning sensation in feet  Psychiatric/Behavioral: Positive for behavioral problems, confusion and agitation.    Allergies  Aspirin  Home Medications   Current Outpatient Rx  Name Route Sig Dispense Refill  .  AMLODIPINE BESYLATE 5 MG PO TABS Oral Take 5 mg by mouth daily.    Marland Kitchen FOLIC ACID 400 MCG PO TABS Oral Take 400 mcg by mouth daily.    Marland Kitchen LEVOTHYROXINE SODIUM 50 MCG PO TABS Oral Take 50 mcg by mouth daily.    Marland Kitchen LORAZEPAM 0.5 MG PO TABS Oral Take 0.5 mg by mouth daily as needed. For anxiety and nerves      BP 169/97  Pulse 80  Temp(Src) 98.4 F (36.9 C) (Oral)  Resp 14  SpO2 97%  Physical Exam  Nursing note and vitals reviewed. Constitutional: She appears well-developed and well-nourished.  HENT:  Head: Normocephalic and atraumatic.  Eyes: Conjunctivae are normal. Right eye exhibits no discharge. Left eye exhibits no discharge.  Neck: Normal range of motion. Neck supple.  Cardiovascular: Normal rate, regular rhythm and normal heart sounds.   Pulses:      Dorsalis pedis pulses are 2+ on the right side, and 2+ on the left side.       Posterior tibial pulses are 2+ on the right side, and 2+ on the left side.  Pulmonary/Chest: Effort normal and breath sounds normal.  Abdominal: Soft. There is no tenderness.  Musculoskeletal: She exhibits no tenderness.       Venous stasis changes in bilateral lower extremities. No gross swelling.   Neurological: She is alert.  Skin: Skin is warm and dry.  Psychiatric:       Patient is somewhat confused.  ED Course  Procedures (including critical care time)  Labs Reviewed  CBC - Abnormal; Notable for the following:    RBC 3.83 (*)    Hemoglobin 11.9 (*)    HCT 35.4 (*)    All other components within normal limits  COMPREHENSIVE METABOLIC PANEL - Abnormal; Notable for the following:    Glucose, Bld 127 (*)    BUN 34 (*)    Creatinine, Ser 1.53 (*)    Total Bilirubin 0.2 (*)    GFR calc non Af Amer 27 (*)    GFR calc Af Amer 31 (*)    All other components within normal limits  DIFFERENTIAL  URINALYSIS, WITH MICROSCOPIC  ETHANOL  URINE RAPID DRUG SCREEN (HOSP PERFORMED)   Dg Chest 2 View  11/20/2011  *RADIOLOGY REPORT*  Clinical Data:  Hypertension.  Medical evaluation for placement.  CHEST - 2 VIEW  Comparison: Plain films of the chest 08/26/2009, 10/15/2010 and 11/08/2010.  Findings: Lungs are clear.  Heart size is normal.  No pneumothorax or pleural effusion.  No focal bony abnormality.  IMPRESSION: No acute finding.  Original Report Authenticated By: Bernadene Bell. D'ALESSIO, M.D.     1. Aggressive behavior     5:29 PM Patient was seen and examined with Dr. Preston Fleeting. Work-up pending.   9:34 PM Patient was medically cleared and I have spoken with ACT. Awaiting input from ACT. Exam has remained unchanged.   MDM  Dementia, patient not safe at home.         Eustace Moore Ma[place], [place] 11/20/11 2302

## 2011-11-20 NOTE — ED Notes (Addendum)
Daughter reports pt has had increase in dementia episodes, wandering, thinks her feet are burning. Pt will call 911 at home. Pt alert, oriented to self, but disoriented to place, time, situation. Daughter reports increase in agitation and becomes aggressive towards family. Other daughter is downtown working on [employer].

## 2011-11-20 NOTE — ED Notes (Signed)
Rainbow drawn 1 blue, 1 lavender, 1 light green, 1 dark green in mini lab

## 2011-11-20 NOTE — ED Notes (Signed)
Pt ambulatory to Exam Room 16 with daughter.  Pt alert and oriented to person and place.  Pt states nothing is wrong with her and this is a big mistake and she shouldn't be here because nothing is wrong with her.  When asked if she knows why she is here, pt states she is here to have her feet checked but there is nothing wrong with her feet and she needs to go home before it gets dark.  Pt calm and cooperative at this time.

## 2011-11-20 NOTE — ED Notes (Signed)
[facility] team at bedside with patient and family members at this time.

## 2011-11-20 NOTE — ED Notes (Signed)
Patient transported to X-ray 

## 2011-11-20 NOTE — ED Notes (Signed)
Pt becoming more agitated, states she needs to see the doctor right now or she is going home.  Dr. Preston Fleeting made aware.

## 2011-11-20 NOTE — ED Notes (Signed)
Rhea Bleacher, PA to bedside at this time to update family on plan of care.

## 2011-11-20 NOTE — ED Notes (Signed)
Pt changed into blue scrubs, red socks placed on pt.

## 2011-11-20 NOTE — ED Notes (Signed)
Pt resting comfortably in bed, remains cooperative with staff at this time, daughter at bedside.
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
## 2011-11-21 ENCOUNTER — Emergency Department (HOSPITAL_COMMUNITY): Payer: Medicare Other

## 2011-11-21 MED ORDER — QUETIAPINE FUMARATE 50 MG PO TABS: 25.0000 mg | ORAL_TABLET | Freq: Two times a day (BID) | ORAL | Status: AC | PRN

## 2011-11-21 NOTE — ED Provider Notes (Addendum)
Pt resting comfortably. Discussed w ACT team, pending decision at [place], SW also working on placement.   Suzi Roots, MD 11/21/11 [phone]   telepsych consult - feels no need for inpatient psych tx, releases from ivc, and recommend to limit bzd and anticholinergic med use, and that may use seroquel 25 mg bid prn for severe agitation/paranoia.   SW consulted - they state unable to get into [facility] and that pt will not be able to placed into [facility] for several days. They indicate pts family refusing home health assistance while they work on ecf placement from home.  Discussed w triad hospitalist, including obs stay on floor for ecf placement - they refuse to make obs stay to hospital, requesting to speak to sw re need for d/c home while family works w pcp on ecf placement issues. sw to talk to triad, and then to family to re-inforce no medical dx requiring inpt hospital admission, and attempt to facilitate home services while pt may be placed from home to ecf.   Suzi Roots, MD 11/21/11 1233   sw has had additional discussion w pt/family re home health services/support, returning to home and working on ecf placement w pcp. sw indicates additional family now present, and in agreement w plan.   Suzi Roots, MD 11/21/11 1308

## 2011-11-21 NOTE — ED Notes (Signed)
Pt psych evaluation completed.

## 2011-11-21 NOTE — ED Notes (Signed)
Contacted Dr. Denton Lank about family wanting X ray of pts tailbone.

## 2011-11-21 NOTE — ED Notes (Addendum)
Pts son at bedside- sts that his mother normally stays with him at night and goes home in the morning. Recently she has been c/o foot pain and stayed at her daughters house. Her daughter gave her neurotin for the pain and the pt experienced an adverse reaction to the medication in which she was paranoid and calling the police daily. Pt has had adverse reactions to ativan in the past as well. Pt son also sts that she has had adverse reaction to plavix in the past involving stomach pain, confusion and "red splotches" on her arms.

## 2011-11-21 NOTE — ED Provider Notes (Signed)
Medical screening examination/treatment/procedure(s) were conducted as a shared visit with non-physician practitioner(s) and myself.  I personally evaluated the patient during the encounter   Dione Booze, MD 11/21/11 [phone]

## 2011-11-21 NOTE — ED Notes (Signed)
Patient transported to X-ray 

## 2011-11-21 NOTE — ED Notes (Signed)
Patient continues to sleep soundly.

## 2011-11-21 NOTE — ED Notes (Signed)
Telepsych consult does not recommend inpatient psychiatric treatment at this time. CSW met with family per request of RN to discuss placement options. CSW informed pt's son and pt's daughter about the option of ALF or Home Health Services. Pt's son and daughter stated they prefer pt be placed at an ALF. CSW informed pt's family that most ALF's are private pay. CSW will initiate ALF placement process. MD and RN notified of disposition plan.

## 2011-11-21 NOTE — ED Notes (Signed)
CSW met with pt and family and provided information for Private Duty Agencies & Assisted Living Facilities within [facility]. Pt's daughter [facility]) requested that Clinical research associate fax pt's labs and examinations to pt's PCP. Pt's granddaughter stated that she does not have the fax number today for her PCP; however, she will telephone CSW after discharge with that information so that it can be faxed. No other concerns verbalized by pt or family members. CSW will remain available to pt to provide assistance as needed.

## 2011-11-21 NOTE — ED Notes (Signed)
Patient continues to sleep after toilet ing  x2  During shift

## 2011-11-21 NOTE — ED Notes (Signed)
Pt discharged with family and told to follow up Monday with PCP for nursing home placement. 1Rx given for sleep and agitation.

## 2011-11-21 NOTE — ED Notes (Signed)
Patient continues to sleep w/o c/o

## 2011-11-21 NOTE — ED Notes (Signed)
EDP notified CSW that pt does not meet inpatient criteria at this time. EDP requested CSW speak to Inpatient MD about process. Inpatient MD informed CSW that pt does not have a medical issue at this time and does not meet inpatient criteria for ALF placement. Inpatient MD stated pt should follow up with PCP for ALF referral due to not meeting inpatient criteria for admission. CSW will discuss with pt and family members this updated information and provide assistance as requested.

## 2011-11-21 NOTE — ED Notes (Signed)
Patient transported back from X-ray 

## 2011-11-21 NOTE — ED Notes (Signed)
Pt asking about family and belongings at this time. Wants to go home- thinks her family is coming to pick her up.

## 2011-11-21 NOTE — Progress Notes (Signed)
I was called by EDP, Dr. Denton Lank, for potential admission to the hospital for Tonya Newman. After reviewing case over the phone with Dr. Denton Lank and reviewing labs and studies done so far, I cannot determine a medical reason for admission. It appears patient has dementia and has become more combative and aggressive at home and family is having a difficult time caring for her and is requesting placement. She was evaluated by psychiatry who does not believe she needs inpatient psych admission. Family requests patient to be admitted and for her to be placed directly from the hospital. Have explained to both EDP and ED SW that if patient does not meet inpatient criteria for admission (which she does not), we will not be able to place her from the hospital anyway. I have advised SW that the best solution is for family to take her home over the weekend and commence placement paperwork with her PCP on Monday.

## 2011-12-02 ENCOUNTER — Encounter (HOSPITAL_COMMUNITY)
Admission: RE | Admit: 2011-12-02 | Discharge: 2011-12-02 | Disposition: A | Discharge status: Home / Self Care | Payer: Medicare Other | Source: Ambulatory Visit | Attending: Nephrology | Admitting: Nephrology

## 2011-12-02 DIAGNOSIS — N184 Chronic kidney disease, stage 4 (severe): Secondary | ICD-10-CM | POA: Insufficient documentation

## 2011-12-02 DIAGNOSIS — D638 Anemia in other chronic diseases classified elsewhere: Secondary | ICD-10-CM | POA: Insufficient documentation

## 2011-12-02 LAB — FERRITIN: Ferritin: 146 ng/mL (ref 10–291)

## 2011-12-02 LAB — IRON AND TIBC: UIBC: 166 ug/dL (ref 125–400)

## 2011-12-02 LAB — POCT HEMOGLOBIN-HEMACUE: Hemoglobin: 11.1 g/dL — ABNORMAL LOW (ref 12.0–15.0)

## 2011-12-02 MED ORDER — DARBEPOETIN ALFA-POLYSORBATE 500 MCG/ML IJ SOLN
100.0000 ug | INTRAMUSCULAR | Status: DC
  Filled 2011-12-02: qty 1

## 2011-12-02 MED ORDER — DARBEPOETIN ALFA-POLYSORBATE 100 MCG/0.5ML IJ SOLN
INTRAMUSCULAR | Status: AC
  Administered 2011-12-02: 100 ug via SUBCUTANEOUS
  Filled 2011-12-02: qty 0.5

## 2011-12-07 ENCOUNTER — Other Ambulatory Visit: Payer: Self-pay | Admitting: Nephrology

## 2012-01-13 ENCOUNTER — Encounter (HOSPITAL_COMMUNITY)
Admission: RE | Admit: 2012-01-13 | Discharge: 2012-01-13 | Disposition: A | Discharge status: Home / Self Care | Payer: Medicare Other | Source: Ambulatory Visit | Attending: Nephrology | Admitting: Nephrology

## 2012-01-13 DIAGNOSIS — N184 Chronic kidney disease, stage 4 (severe): Secondary | ICD-10-CM | POA: Insufficient documentation

## 2012-01-13 DIAGNOSIS — D638 Anemia in other chronic diseases classified elsewhere: Secondary | ICD-10-CM | POA: Insufficient documentation

## 2012-01-13 LAB — RENAL FUNCTION PANEL
Albumin: 3.2 g/dL — ABNORMAL LOW (ref 3.5–5.2)
GFR calc Af Amer: 33 mL/min — ABNORMAL LOW (ref >90)
GFR calc non Af Amer: 28 mL/min — ABNORMAL LOW (ref >90)
Glucose, Bld: 113 mg/dL — ABNORMAL HIGH (ref 70–99)
Phosphorus: 3.9 mg/dL (ref 2.3–4.6)
Potassium: 4.1 mEq/L (ref 3.5–5.1)
Sodium: 143 mEq/L (ref 135–145)

## 2012-01-13 LAB — IRON AND TIBC: Iron: 74 ug/dL (ref 42–135)

## 2012-01-13 MED ORDER — DARBEPOETIN ALFA-POLYSORBATE 500 MCG/ML IJ SOLN
100.0000 ug | INTRAMUSCULAR | Status: DC
  Filled 2012-01-13: qty 1

## 2012-01-13 MED ORDER — DARBEPOETIN ALFA-POLYSORBATE 100 MCG/0.5ML IJ SOLN
INTRAMUSCULAR | Status: AC
  Administered 2012-01-13: 100 ug via SUBCUTANEOUS
  Filled 2012-01-13: qty 0.5

## 2012-02-29 ENCOUNTER — Other Ambulatory Visit (HOSPITAL_COMMUNITY): Payer: Self-pay | Admitting: *Deleted

## 2012-03-02 ENCOUNTER — Encounter (HOSPITAL_COMMUNITY)
Admission: RE | Admit: 2012-03-02 | Discharge: 2012-03-02 | Disposition: A | Discharge status: Home / Self Care | Payer: Medicare Other | Source: Ambulatory Visit | Attending: Nephrology | Admitting: Nephrology

## 2012-03-02 DIAGNOSIS — D638 Anemia in other chronic diseases classified elsewhere: Secondary | ICD-10-CM | POA: Insufficient documentation

## 2012-03-02 DIAGNOSIS — N184 Chronic kidney disease, stage 4 (severe): Secondary | ICD-10-CM | POA: Insufficient documentation

## 2012-03-02 LAB — RENAL FUNCTION PANEL
BUN: 29 mg/dL — ABNORMAL HIGH (ref 6–23)
CO2: 27 mEq/L (ref 19–32)
Chloride: 107 mEq/L (ref 96–112)
GFR calc Af Amer: 36 mL/min — ABNORMAL LOW (ref >90)
Glucose, Bld: 100 mg/dL — ABNORMAL HIGH (ref 70–99)
Potassium: 4.1 mEq/L (ref 3.5–5.1)
Sodium: 142 mEq/L (ref 135–145)

## 2012-03-02 LAB — IRON AND TIBC
Iron: 62 ug/dL (ref 42–135)
TIBC: 258 ug/dL (ref 250–470)
UIBC: 196 ug/dL (ref 125–400)

## 2012-03-02 LAB — FERRITIN: Ferritin: 148 ng/mL (ref 10–291)

## 2012-03-02 MED ORDER — DARBEPOETIN ALFA-POLYSORBATE 100 MCG/0.5ML IJ SOLN
INTRAMUSCULAR | Status: AC
  Administered 2012-03-02: 100 ug via SUBCUTANEOUS
  Filled 2012-03-02: qty 0.5

## 2012-03-02 MED ORDER — DARBEPOETIN ALFA-POLYSORBATE 500 MCG/ML IJ SOLN: 100.0000 ug | INTRAMUSCULAR | Status: DC

## 2012-03-03 ENCOUNTER — Emergency Department (HOSPITAL_BASED_OUTPATIENT_CLINIC_OR_DEPARTMENT_OTHER)
Admission: EM | Admit: 2012-03-03 | Discharge: 2012-03-03 | Disposition: A | Discharge status: Home / Self Care | Payer: Medicare Other | Attending: Emergency Medicine | Admitting: Emergency Medicine

## 2012-03-03 ENCOUNTER — Encounter (HOSPITAL_BASED_OUTPATIENT_CLINIC_OR_DEPARTMENT_OTHER): Payer: Self-pay

## 2012-03-03 ENCOUNTER — Emergency Department (HOSPITAL_BASED_OUTPATIENT_CLINIC_OR_DEPARTMENT_OTHER): Payer: Medicare Other

## 2012-03-03 DIAGNOSIS — R233 Spontaneous ecchymoses: Secondary | ICD-10-CM | POA: Insufficient documentation

## 2012-03-03 DIAGNOSIS — M7989 Other specified soft tissue disorders: Secondary | ICD-10-CM | POA: Insufficient documentation

## 2012-03-03 DIAGNOSIS — M79609 Pain in unspecified limb: Secondary | ICD-10-CM | POA: Insufficient documentation

## 2012-03-03 DIAGNOSIS — I10 Essential (primary) hypertension: Secondary | ICD-10-CM | POA: Insufficient documentation

## 2012-03-03 HISTORY — DX: Calculus of kidney: N20.0

## 2012-03-03 MED ORDER — ACETAMINOPHEN 325 MG PO TABS
650.0000 mg | ORAL_TABLET | Freq: Once | ORAL | Status: AC
  Administered 2012-03-03: 650 mg via ORAL
  Filled 2012-03-03: qty 2

## 2012-03-03 NOTE — ED Notes (Signed)
PA at bedside.

## 2012-03-03 NOTE — ED Provider Notes (Signed)
Medical screening examination/treatment/procedure(s) were conducted as a shared visit with non-physician practitioner(s) and myself.  I personally evaluated the patient during the encounter  Patient with a area of mild swelling over her right mid dorsal forearm.  It has since increased to an area approximately 6 cm across.  Area appears to be consistent with hematoma as it is without erythema or warmth to suggest infection.  This is likely worsened by the patient's use of Plavix.  Patient will be discharged home with followup with her primary care physician as needed.  Nat Christen, MD 03/03/12 [phone]

## 2012-03-03 NOTE — ED Notes (Signed)
[place] placed at bedside per PA request

## 2012-03-03 NOTE — Discharge Instructions (Signed)
A hematoma is a blood collection under the skin. Ice to reduce pain and swelling. Take tylenol as needed for pain. Follow up with your primary care doctor in office tomorrow for recheck of arm. Return to closest ER for emergent changing or worsening of symptoms.

## 2012-03-03 NOTE — ED Notes (Signed)
C/o irritation to right arm yesterday-c/o increased area/swelling today-denies injury

## 2012-03-03 NOTE — ED Provider Notes (Signed)
Medical screening examination/treatment/procedure(s) were conducted as a shared visit with non-physician practitioner(s) and myself.  I personally evaluated the patient during the encounter   Nat Christen, MD 03/03/12 [phone]

## 2012-03-03 NOTE — ED Provider Notes (Signed)
History     CSN: [id number]  Arrival date & time 03/03/12  1419   First MD Initiated Contact with Patient 03/03/12 1502      Chief Complaint  Patient presents with  . Arm Pain    (Consider location/radiation/quality/duration/timing/severity/associated sxs/prior treatment) HPI Patient presents to emergency department with her son at bedside with complaint of swelling to her right forearm. Patient lives in her own home however her son lives next door and the patient stays with her son at night. The son states that he was helping his mother out of the car a few hours prior to arrival and noticed a thumb size area of swelling over her right forearm. The patient denies known injury to forearm. However son states that over the next few hours the swelling continued grows with a "purplish color." Patient is complaining of mild pain at area of swelling. Patient has taken nothing for pain prior to arrival. She denies aggravating or alleviating symptoms. Patient takes daily Plavix but denies any other anticoagulant use. Past Medical History  Diagnosis Date  . Hypertension   . Thyroid disease   . Kidney stone     Past Surgical History  Procedure Date  . Kidney stone surgery     No family history on file.  History  Substance Use Topics  . Smoking status: Never Smoker   . Smokeless tobacco: Never Used  . Alcohol Use: No    OB History    Grav Para Term Preterm Abortions TAB SAB Ect Mult Living                  Review of Systems  All other systems reviewed and are negative.    Allergies  Aspirin  Home Medications   Current Outpatient Rx  Name Route Sig Dispense Refill  . AMLODIPINE BESYLATE 5 MG PO TABS Oral Take 5 mg by mouth daily.    Marland Kitchen CLOPIDOGREL BISULFATE 75 MG PO TABS Oral Take 75 mg by mouth daily.    Marland Kitchen FOLIC ACID 400 MCG PO TABS Oral Take 400 mcg by mouth daily.    Marland Kitchen NEURONTIN PO Oral Take by mouth.    Marland Kitchen LEVOTHYROXINE SODIUM 50 MCG PO TABS Oral Take 50 mcg by mouth  daily.    Marland Kitchen LISINOPRIL 20 MG PO TABS Oral Take 20 mg by mouth daily.    Marland Kitchen LORAZEPAM 0.5 MG PO TABS Oral Take 0.5 mg by mouth daily as needed. For anxiety and nerves      BP 185/64  Pulse 88  Temp(Src) 98.3 F (36.8 C) (Oral)  Resp 18  Ht 5\' 3"  (1.6 m)  Wt 117 lb (53.071 kg)  BMI 20.73 kg/m2  SpO2 96%  Physical Exam  Nursing note and vitals reviewed. Constitutional: She is oriented to person, place, and time. She appears well-developed and well-nourished.  HENT:  Head: Normocephalic and atraumatic.  Eyes: Conjunctivae are normal.  Cardiovascular: Normal rate, regular rhythm, normal heart sounds and intact distal pulses.  Exam reveals no gallop and no friction rub.   No murmur heard. Pulmonary/Chest: Effort normal and breath sounds normal. No respiratory distress. She has no wheezes. She has no rales. She exhibits no tenderness.  Musculoskeletal: Normal range of motion. She exhibits edema and tenderness.       6x4cm soft tissue swelling of right posterior forearm with mild TTP and underlying fluctuance and purplish coloration consistent with hematoma. Good radial pulse. FROM of RUE with little pain at site of swelling.  Neurological: She is alert and oriented to person, place, and time.  Skin: Skin is warm and dry. No erythema.    ED Course  Procedures (including critical care time)  PO tylenol.   Labs Reviewed - No data to display Dg Forearm Right  03/03/2012  *RADIOLOGY REPORT*  Clinical Data: Contusion right forearm, knot, pain, swelling  RIGHT FOREARM - 2 VIEW  Comparison: None  Findings: Bones diffusely demineralized. Tiny olecranon spur. Focal soft tissue swelling at dorsal margin of mid right forearm. No acute fracture, dislocation or bone destruction. Scattered atherosclerotic calcification.  IMPRESSION: No acute osseous abnormalities.  Original Report Authenticated By: Lollie Marrow, M.D.     1. Spontaneous hematoma of forearm     Dr. Golda Acre evaluated patient at  bedside and agrees that swelling is consistent with hematoma.   MDM  Though patient denies known injury, swelling consistent with hematoma. Family is agreeable to close follow up with PCP tomorrow for recheck of swelling. No other injury noted. FROM of RUE with only mild pain at site of hematoma. Good radial pulse bilaterally and normal sensation of entire RUE.   No erythema or heat to suggest infection.       Drucie Opitz, [place] 03/03/12 1539  [place], [place] 03/03/12 1540

## 2012-03-06 ENCOUNTER — Encounter (HOSPITAL_COMMUNITY): Payer: Self-pay

## 2012-03-06 ENCOUNTER — Emergency Department (HOSPITAL_COMMUNITY): Payer: Medicare Other

## 2012-03-06 ENCOUNTER — Emergency Department (HOSPITAL_COMMUNITY)
Admission: EM | Admit: 2012-03-06 | Discharge: 2012-03-07 | Disposition: A | Discharge status: Home / Self Care | Payer: Medicare Other | Attending: Emergency Medicine | Admitting: Emergency Medicine

## 2012-03-06 DIAGNOSIS — I10 Essential (primary) hypertension: Secondary | ICD-10-CM | POA: Insufficient documentation

## 2012-03-06 DIAGNOSIS — S5010XA Contusion of unspecified forearm, initial encounter: Secondary | ICD-10-CM | POA: Insufficient documentation

## 2012-03-06 DIAGNOSIS — I872 Venous insufficiency (chronic) (peripheral): Secondary | ICD-10-CM | POA: Insufficient documentation

## 2012-03-06 DIAGNOSIS — M79609 Pain in unspecified limb: Secondary | ICD-10-CM | POA: Insufficient documentation

## 2012-03-06 DIAGNOSIS — R110 Nausea: Secondary | ICD-10-CM | POA: Insufficient documentation

## 2012-03-06 DIAGNOSIS — X58XXXA Exposure to other specified factors, initial encounter: Secondary | ICD-10-CM | POA: Insufficient documentation

## 2012-03-06 DIAGNOSIS — Z79899 Other long term (current) drug therapy: Secondary | ICD-10-CM | POA: Insufficient documentation

## 2012-03-06 DIAGNOSIS — F0390 Unspecified dementia without behavioral disturbance: Secondary | ICD-10-CM | POA: Insufficient documentation

## 2012-03-06 DIAGNOSIS — F22 Delusional disorders: Secondary | ICD-10-CM | POA: Insufficient documentation

## 2012-03-06 HISTORY — DX: Unspecified dementia, unspecified severity, without behavioral disturbance, psychotic disturbance, mood disturbance, and anxiety: F03.90

## 2012-03-06 LAB — COMPREHENSIVE METABOLIC PANEL
CO2: 25 mEq/L (ref 19–32)
Calcium: 9.2 mg/dL (ref 8.4–10.5)
Creatinine, Ser: 1.7 mg/dL — ABNORMAL HIGH (ref 0.50–1.10)
GFR calc Af Amer: 28 mL/min — ABNORMAL LOW (ref >90)
GFR calc non Af Amer: 24 mL/min — ABNORMAL LOW (ref >90)
Glucose, Bld: 115 mg/dL — ABNORMAL HIGH (ref 70–99)
Sodium: 144 mEq/L (ref 135–145)
Total Protein: 6.6 g/dL (ref 6.0–8.3)

## 2012-03-06 LAB — APTT: aPTT: 35 seconds (ref 24–37)

## 2012-03-06 LAB — CBC
HCT: 33.9 % — ABNORMAL LOW (ref 36.0–46.0)
MCH: 30.6 pg (ref 26.0–34.0)
MCV: 91.9 fL (ref 78.0–100.0)
Platelets: 230 10*3/uL (ref 150–400)
RBC: 3.69 MIL/uL — ABNORMAL LOW (ref 3.87–5.11)
RDW: 13.2 % (ref 11.5–15.5)
WBC: 7.9 10*3/uL (ref 4.0–10.5)

## 2012-03-06 LAB — DIFFERENTIAL
Eosinophils Absolute: 0.1 10*3/uL (ref 0.0–0.7)
Eosinophils Relative: 1 % (ref 0–5)
Lymphocytes Relative: 28 % (ref 12–46)
Lymphs Abs: 2.2 10*3/uL (ref 0.7–4.0)
Monocytes Absolute: 0.8 10*3/uL (ref 0.1–1.0)

## 2012-03-06 LAB — URINALYSIS, ROUTINE W REFLEX MICROSCOPIC
Bilirubin Urine: NEGATIVE
Glucose, UA: NEGATIVE mg/dL
Hgb urine dipstick: NEGATIVE
Protein, ur: NEGATIVE mg/dL
Urobilinogen, UA: 0.2 mg/dL (ref 0.0–1.0)

## 2012-03-06 LAB — PROTIME-INR
INR: 0.98 (ref 0.00–1.49)
Prothrombin Time: 13.2 seconds (ref 11.6–15.2)

## 2012-03-06 LAB — URINE MICROSCOPIC-ADD ON

## 2012-03-06 NOTE — ED Notes (Signed)
Bed:WA15<BR> Expected date:<BR> Expected time:<BR> Means of arrival:<BR> Comments:<BR> triage
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
## 2012-03-06 NOTE — ED Notes (Signed)
Requesting psych evaluation.  Unable to care for this pt at home anymore
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
## 2012-03-07 NOTE — ED Notes (Signed)
Patient evaluated in consult by Dr. Mahala Menghini.  He does not feel PT required medical admission or social admission and recommends discharge home in the continue care of family.   He has related this patient's family.    1:19 AM At discharge, PTs family very upset and were under the impression that PT was to be admitted for placement.  I again consulted medicine for clarification  1:34 AM DR Mahala Menghini states to me no medical reason for admit and recs seeing PCP in the am for further evaluation of dementia and possible placement in nursing facility.   Sunnie Nielsen, MD 03/07/12 [phone]

## 2012-03-07 NOTE — Discharge Instructions (Signed)
Dementia  Dementia is a general term for problems with brain function. A person with dementia has memory loss and a hard time with at least one other brain function such as thinking, speaking, or problem solving. Dementia can affect social functioning, how you do your job, your mood, or your personality. The changes may be hidden for a long time. The earliest forms of this disease are usually not detected by family or friends.  Dementia can be:  Irreversible.  Potentially reversible.  Partially reversible.  Progressive. This means it can get worse over time.  CAUSES  Irreversible dementia causes may include:  Degeneration of brain cells (Alzheimer's disease or lewy body dementia).  Multiple small strokes (vascular dementia).  Infection (chronic meningitis or Creutzfelt-Jakob disease).  Frontotemporal dementia. This affects younger people, age 66 to 40, compared to those who have Alzheimer's disease.  Dementia associated with other disorders like Parkinson's disease, Huntington's disease, or HIV-associated dementia.  Potentially or partially reversible dementia causes may include:  Medicines.  Metabolic causes such as excessive alcohol intake, vitamin B12 deficiency, or thyroid disease.  Masses or pressure in the brain such as a tumor, blood clot, or hydrocephalus.  SYMPTOMS  Symptoms are often hard to detect. Family members or coworkers may not notice them early in the disease process. Different people with dementia may have different symptoms. Symptoms can include:  A hard time with memory, especially recent memory. Long-term memory may not be impaired.  Asking the same question multiple times or forgetting something someone just said.  A hard time speaking your thoughts or finding certain words.  A hard time solving problems or performing familiar tasks (such as how to use a telephone).  Sudden changes in mood.  Changes in personality, especially increasing moodiness or mistrust.    Depression.  A hard time understanding complex ideas that were never a problem in the past.  DIAGNOSIS  There are no specific tests for dementia.  Your caregiver may recommend a thorough evaluation. This is because some forms of dementia can be reversible. The evaluation will likely include a physical exam and getting a detailed history from you and a family member. The history often gives the best clues and suggestions for a diagnosis.  Memory testing may be done. A detailed brain function evaluation called neuropsychologic testing may be helpful.  Lab tests and brain imaging (such as a CT scan or MRI scan) are sometimes important.  Sometimes observation and re-evaluation over time is very helpful.  TREATMENT  Treatment depends on the cause.  If the problem is a vitamin deficiency, it may be helped or cured with supplements.  For dementias such as Alzheimer's disease, medicines are available to stabilize or slow the course of the disease. There are no cures for this type of dementia.  Your caregiver can help direct you to groups, organizations, and other caregivers to help with decisions in the care of you or your loved one.  HOME CARE INSTRUCTIONS  The care of individuals with dementia is varied and dependent upon the progression of the dementia. The following suggestions are intended for the person living with, or caring for, the person with dementia.  Create a safe environment.  Remove the locks on bathroom doors to prevent the person from accidentally locking himself or herself in.  Use childproof latches on kitchen cabinets and any place where cleaning supplies, chemicals, or alcohol are kept.  Use childproof covers in unused electrical outlets.  Install childproof devices to keep doors and windows  secured.  Remove stove knobs or install safety knobs and an automatic shut-off on the stove.  Lower the temperature on water heaters.  Label medicines and keep them locked up.  Secure  knives, lighters, matches, power tools, and guns, and keep these items out of reach.  Keep the house free from clutter. Remove rugs or anything that might contribute to a fall.  Remove objects that might break and hurt the person.  Make sure lighting is good, both inside and outside.  Install grab rails as needed.  Use a monitoring device to alert you to falls or other needs for help.  Reduce confusion.  Keep familiar objects and people around.  Use night lights or dim lights at night.  Label items or areas.  Use reminders, notes, or directions for daily activities or tasks.  Keep a simple, consistent routine for waking, meals, bathing, dressing, and bedtime.  Create a calm, quiet environment.  Place large clocks and calendars prominently.  Display emergency numbers and home address near all telephones.  Use cues to establish different times of the day. An example is to open curtains to let the natural light in during the day.  Use effective communication.  Choose simple words and short sentences.  Use a gentle, calm tone of voice.  Be careful not to interrupt.  If the person is struggling to find a word or communicate a thought, try to provide the word or thought.  Ask one question at a time. Allow the person ample time to answer questions. Repeat the question again if the person does not respond.  Reduce nighttime restlessness.  Provide a comfortable bed.  Have a consistent nighttime routine.  Ensure a regular walking or physical activity schedule. Involve the person in daily activities as much as possible.  Limit napping during the day.  Limit caffeine.  Attend social events that stimulate rather than overwhelm the senses.  Encourage good nutrition and hydration.  Reduce distractions during meal times and snacks.  Avoid foods that are too hot or too cold.  Monitor chewing and swallowing ability.  Continue with routine vision, hearing, dental, and medical screenings.  Only give  over-the-counter or prescription medicines as directed by the caregiver.  Monitor driving abilities. Do not allow the person to drive when safe driving is no longer possible.  Register with an identification program which could provide location assistance in the event of a missing person situation.  SEEK MEDICAL CARE IF:  New behavioral problems start such as moodiness, aggressiveness, or seeing things that are not there (hallucinations).  Any new problem with brain function happens. This includes problems with balance, speech, or falling a lot.  Problems with swallowing develop.  Any symptoms of other illness happen.  Small changes or worsening in any aspect of brain function can be a sign that the illness is getting worse. It can also be a sign of another medical illness such as infection. Seeing a caregiver right away is important.  SEEK IMMEDIATE MEDICAL CARE IF:  A fever develops.  New or worsened confusion develops.  New or worsened sleepiness develops.  Staying awake becomes hard to do.

## 2012-03-10 ENCOUNTER — Emergency Department (HOSPITAL_BASED_OUTPATIENT_CLINIC_OR_DEPARTMENT_OTHER)
Admission: EM | Admit: 2012-03-10 | Discharge: 2012-03-10 | Disposition: A | Discharge status: Home / Self Care | Payer: Medicare Other | Attending: Emergency Medicine | Admitting: Emergency Medicine

## 2012-03-10 ENCOUNTER — Encounter (HOSPITAL_BASED_OUTPATIENT_CLINIC_OR_DEPARTMENT_OTHER): Payer: Self-pay | Admitting: Emergency Medicine

## 2012-03-10 DIAGNOSIS — I10 Essential (primary) hypertension: Secondary | ICD-10-CM | POA: Insufficient documentation

## 2012-03-10 DIAGNOSIS — R011 Cardiac murmur, unspecified: Secondary | ICD-10-CM | POA: Insufficient documentation

## 2012-03-10 DIAGNOSIS — F0390 Unspecified dementia without behavioral disturbance: Secondary | ICD-10-CM | POA: Insufficient documentation

## 2012-03-10 DIAGNOSIS — F29 Unspecified psychosis not due to a substance or known physiological condition: Secondary | ICD-10-CM | POA: Insufficient documentation

## 2012-03-10 DIAGNOSIS — R4182 Altered mental status, unspecified: Secondary | ICD-10-CM | POA: Insufficient documentation

## 2012-03-10 DIAGNOSIS — S60229A Contusion of unspecified hand, initial encounter: Secondary | ICD-10-CM | POA: Insufficient documentation

## 2012-03-10 DIAGNOSIS — Z79899 Other long term (current) drug therapy: Secondary | ICD-10-CM | POA: Insufficient documentation

## 2012-03-10 DIAGNOSIS — S8010XA Contusion of unspecified lower leg, initial encounter: Secondary | ICD-10-CM | POA: Insufficient documentation

## 2012-03-10 DIAGNOSIS — X58XXXA Exposure to other specified factors, initial encounter: Secondary | ICD-10-CM | POA: Insufficient documentation

## 2012-03-10 DIAGNOSIS — E079 Disorder of thyroid, unspecified: Secondary | ICD-10-CM | POA: Insufficient documentation

## 2012-03-10 DIAGNOSIS — Z87442 Personal history of urinary calculi: Secondary | ICD-10-CM | POA: Insufficient documentation

## 2012-03-10 MED ORDER — LORAZEPAM 1 MG PO TABS
1.0000 mg | ORAL_TABLET | Freq: Once | ORAL | Status: AC
  Administered 2012-03-10: 1 mg via ORAL
  Filled 2012-03-10: qty 1
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
## 2012-03-10 NOTE — ED Notes (Signed)
md spoke with patients primary physician and discussed case, plan to send pt home with family and start placement tomorrow with primary physician.

## 2012-03-10 NOTE — Discharge Instructions (Signed)
Your primary care physician is going to begin the process of working on nursing home placement.  You should receive a call tomorrow to followup on this process.  This process will likely take at least one to 2 weeks to occur.  Please continue to follow up with your doctor at the office regarding further concerns with the dementia.  Dementia Dementia is a general term for problems with brain function. A person with dementia has memory loss and a hard time with at least one other brain function such as thinking, speaking, or problem solving. Dementia can affect social functioning, how you do your job, your mood, or your personality. The changes may be hidden for a long time. The earliest forms of this disease are usually not detected by family or friends. Dementia can be:  Irreversible.   Potentially reversible.   Partially reversible.   Progressive. This means it can get worse over time.  CAUSES  Irreversible dementia causes may include:  Degeneration of brain cells (Alzheimer's disease or lewy body dementia).   Multiple small strokes (vascular dementia).   Infection (chronic meningitis or Creutzfelt-Jakob disease).   Frontotemporal dementia. This affects younger people, age 46 to 28, compared to those who have Alzheimer's disease.   Dementia associated with other disorders like Parkinson's disease, Huntington's disease, or HIV-associated dementia.  Potentially or partially reversible dementia causes may include:  Medicines.   Metabolic causes such as excessive alcohol intake, vitamin B12 deficiency, or thyroid disease.   Masses or pressure in the brain such as a tumor, blood clot, or hydrocephalus.  SYMPTOMS  Symptoms are often hard to detect. Family members or coworkers may not notice them early in the disease process. Different people with dementia may have different symptoms. Symptoms can include:  A hard time with memory, especially recent memory. Long-term memory may not be  impaired.   Asking the same question multiple times or forgetting something someone just said.   A hard time speaking your thoughts or finding certain words.   A hard time solving problems or performing familiar tasks (such as how to use a telephone).   Sudden changes in mood.   Changes in personality, especially increasing moodiness or mistrust.   Depression.   A hard time understanding complex ideas that were never a problem in the past.  DIAGNOSIS  There are no specific tests for dementia.   Your caregiver may recommend a thorough evaluation. This is because some forms of dementia can be reversible. The evaluation will likely include a physical exam and getting a detailed history from you and a family member. The history often gives the best clues and suggestions for a diagnosis.   Memory testing may be done. A detailed brain function evaluation called neuropsychologic testing may be helpful.   Lab tests and brain imaging (such as a CT scan or MRI scan) are sometimes important.   Sometimes observation and re-evaluation over time is very helpful.  TREATMENT  Treatment depends on the cause.   If the problem is a vitamin deficiency, it may be helped or cured with supplements.   For dementias such as Alzheimer's disease, medicines are available to stabilize or slow the course of the disease. There are no cures for this type of dementia.   Your caregiver can help direct you to groups, organizations, and other caregivers to help with decisions in the care of you or your loved one.  HOME CARE INSTRUCTIONS The care of individuals with dementia is varied and dependent upon  the progression of the dementia. The following suggestions are intended for the person living with, or caring for, the person with dementia.  Create a safe environment.   Remove the locks on bathroom doors to prevent the person from accidentally locking himself or herself in.   Use childproof latches on kitchen  cabinets and any place where cleaning supplies, chemicals, or alcohol are kept.   Use childproof covers in unused electrical outlets.   Install childproof devices to keep doors and windows secured.   Remove stove knobs or install safety knobs and an automatic shut-off on the stove.   Lower the temperature on water heaters.   Label medicines and keep them locked up.   Secure knives, lighters, matches, power tools, and guns, and keep these items out of reach.   Keep the house free from clutter. Remove rugs or anything that might contribute to a fall.   Remove objects that might break and hurt the person.   Make sure lighting is good, both inside and outside.   Install grab rails as needed.   Use a monitoring device to alert you to falls or other needs for help.   Reduce confusion.   Keep familiar objects and people around.   Use night lights or dim lights at night.   Label items or areas.   Use reminders, notes, or directions for daily activities or tasks.   Keep a simple, consistent routine for waking, meals, bathing, dressing, and bedtime.   Create a calm, quiet environment.   Place large clocks and calendars prominently.   Display emergency numbers and home address near all telephones.   Use cues to establish different times of the day. An example is to open curtains to let the natural light in during the day.    Use effective communication.   Choose simple words and short sentences.   Use a gentle, calm tone of voice.   Be careful not to interrupt.   If the person is struggling to find a word or communicate a thought, try to provide the word or thought.   Ask one question at a time. Allow the person ample time to answer questions. Repeat the question again if the person does not respond.   Reduce nighttime restlessness.   Provide a comfortable bed.   Have a consistent nighttime routine.   Ensure a regular walking or physical activity schedule. Involve  the person in daily activities as much as possible.   Limit napping during the day.   Limit caffeine.   Attend social events that stimulate rather than overwhelm the senses.   Encourage good nutrition and hydration.   Reduce distractions during meal times and snacks.   Avoid foods that are too hot or too cold.   Monitor chewing and swallowing ability.   Continue with routine vision, hearing, dental, and medical screenings.   Only give over-the-counter or prescription medicines as directed by the caregiver.   Monitor driving abilities. Do not allow the person to drive when safe driving is no longer possible.   Register with an identification program which could provide location assistance in the event of a missing person situation.  SEEK MEDICAL CARE IF:   New behavioral problems start such as moodiness, aggressiveness, or seeing things that are not there (hallucinations).   Any new problem with brain function happens. This includes problems with balance, speech, or falling a lot.   Problems with swallowing develop.   Any symptoms of other illness happen.  Small  changes or worsening in any aspect of brain function can be a sign that the illness is getting worse. It can also be a sign of another medical illness such as infection. Seeing a caregiver right away is important. SEEK IMMEDIATE MEDICAL CARE IF:   A fever develops.   New or worsened confusion develops.   New or worsened sleepiness develops.   Staying awake becomes hard to do.  Document Released: 03/31/2001 Document Revised: 09/24/2011 Document Reviewed: 03/02/2011 [facility] Patient Information 2012 [place], [place].

## 2012-04-12 ENCOUNTER — Other Ambulatory Visit (HOSPITAL_COMMUNITY): Payer: Self-pay | Admitting: *Deleted

## 2012-04-13 ENCOUNTER — Encounter (HOSPITAL_COMMUNITY)
Admission: RE | Admit: 2012-04-13 | Discharge: 2012-04-13 | Disposition: A | Discharge status: Home / Self Care | Payer: Medicare Other | Source: Ambulatory Visit | Attending: Nephrology | Admitting: Nephrology

## 2012-04-13 DIAGNOSIS — N184 Chronic kidney disease, stage 4 (severe): Secondary | ICD-10-CM | POA: Insufficient documentation

## 2012-04-13 DIAGNOSIS — D638 Anemia in other chronic diseases classified elsewhere: Secondary | ICD-10-CM | POA: Insufficient documentation

## 2012-04-13 LAB — RENAL FUNCTION PANEL
Albumin: 3.5 g/dL (ref 3.5–5.2)
CO2: 28 mEq/L (ref 19–32)
Chloride: 106 mEq/L (ref 96–112)
Creatinine, Ser: 1.38 mg/dL — ABNORMAL HIGH (ref 0.50–1.10)
GFR calc Af Amer: 36 mL/min — ABNORMAL LOW (ref >90)
GFR calc non Af Amer: 31 mL/min — ABNORMAL LOW (ref >90)
Potassium: 4.5 mEq/L (ref 3.5–5.1)
Sodium: 143 mEq/L (ref 135–145)

## 2012-04-13 LAB — IRON AND TIBC
Iron: 73 ug/dL (ref 42–135)
TIBC: 252 ug/dL (ref 250–470)

## 2012-04-13 LAB — POCT HEMOGLOBIN-HEMACUE: Hemoglobin: 10.7 g/dL — ABNORMAL LOW (ref 12.0–15.0)

## 2012-04-13 MED ORDER — DARBEPOETIN ALFA-POLYSORBATE 100 MCG/0.5ML IJ SOLN
INTRAMUSCULAR | Status: AC
  Administered 2012-04-13: 100 ug via SUBCUTANEOUS
  Filled 2012-04-13: qty 0.5

## 2012-04-13 MED ORDER — DARBEPOETIN ALFA-POLYSORBATE 500 MCG/ML IJ SOLN
100.0000 ug | INTRAMUSCULAR | Status: DC
  Filled 2012-04-13: qty 1

## 2012-05-25 ENCOUNTER — Emergency Department (HOSPITAL_BASED_OUTPATIENT_CLINIC_OR_DEPARTMENT_OTHER)
Admission: EM | Admit: 2012-05-25 | Discharge: 2012-05-25 | Disposition: A | Discharge status: Skilled Nursing Facility | Payer: Medicare Other | Attending: Emergency Medicine | Admitting: Emergency Medicine

## 2012-05-25 ENCOUNTER — Encounter (HOSPITAL_COMMUNITY): Payer: Medicare Other

## 2012-05-25 ENCOUNTER — Encounter (HOSPITAL_BASED_OUTPATIENT_CLINIC_OR_DEPARTMENT_OTHER): Payer: Self-pay

## 2012-05-25 DIAGNOSIS — E860 Dehydration: Secondary | ICD-10-CM | POA: Insufficient documentation

## 2012-05-25 DIAGNOSIS — I440 Atrioventricular block, first degree: Secondary | ICD-10-CM | POA: Insufficient documentation

## 2012-05-25 DIAGNOSIS — Z87442 Personal history of urinary calculi: Secondary | ICD-10-CM | POA: Insufficient documentation

## 2012-05-25 DIAGNOSIS — I10 Essential (primary) hypertension: Secondary | ICD-10-CM | POA: Insufficient documentation

## 2012-05-25 DIAGNOSIS — N390 Urinary tract infection, site not specified: Secondary | ICD-10-CM | POA: Insufficient documentation

## 2012-05-25 DIAGNOSIS — F0390 Unspecified dementia without behavioral disturbance: Secondary | ICD-10-CM | POA: Insufficient documentation

## 2012-05-25 DIAGNOSIS — E079 Disorder of thyroid, unspecified: Secondary | ICD-10-CM | POA: Insufficient documentation

## 2012-05-25 LAB — CBC WITH DIFFERENTIAL/PLATELET
Basophils Relative: 1 % (ref 0–1)
Eosinophils Relative: 1 % (ref 0–5)
HCT: 31.9 % — ABNORMAL LOW (ref 36.0–46.0)
Hemoglobin: 10.7 g/dL — ABNORMAL LOW (ref 12.0–15.0)
Lymphocytes Relative: 19 % (ref 12–46)
MCHC: 33.5 g/dL (ref 30.0–36.0)
MCV: 93.3 fL (ref 78.0–100.0)
Monocytes Absolute: 0.6 10*3/uL (ref 0.1–1.0)
Monocytes Relative: 8 % (ref 3–12)
Neutro Abs: 5.5 10*3/uL (ref 1.7–7.7)

## 2012-05-25 LAB — URINALYSIS, ROUTINE W REFLEX MICROSCOPIC
Leukocytes, UA: NEGATIVE
Nitrite: NEGATIVE
Protein, ur: 100 mg/dL — AB
Urobilinogen, UA: 0.2 mg/dL (ref 0.0–1.0)

## 2012-05-25 LAB — COMPREHENSIVE METABOLIC PANEL
BUN: 33 mg/dL — ABNORMAL HIGH (ref 6–23)
CO2: 30 mEq/L (ref 19–32)
Calcium: 9.9 mg/dL (ref 8.4–10.5)
Chloride: 104 mEq/L (ref 96–112)
Creatinine, Ser: 1.5 mg/dL — ABNORMAL HIGH (ref 0.50–1.10)
GFR calc non Af Amer: 28 mL/min — ABNORMAL LOW (ref >90)
Total Bilirubin: 0.2 mg/dL — ABNORMAL LOW (ref 0.3–1.2)

## 2012-05-25 LAB — URINE MICROSCOPIC-ADD ON

## 2012-05-25 MED ORDER — SODIUM CHLORIDE 0.9 % IV BOLUS (SEPSIS)
500.0000 mL | Freq: Once | INTRAVENOUS | Status: AC
  Administered 2012-05-25: 13:00:00 via INTRAVENOUS

## 2012-05-25 MED ORDER — SODIUM CHLORIDE 0.9 % IV BOLUS (SEPSIS)
500.0000 mL | Freq: Once | INTRAVENOUS | Status: AC
  Administered 2012-05-25: 500 mL via INTRAVENOUS

## 2012-05-25 NOTE — ED Notes (Signed)
Daughter reports pt has recently moved to [facility] facility and has not been drinking as much as she should.  She has had increased urination and she is concerned about a UTI and dehydration.

## 2012-05-25 NOTE — ED Provider Notes (Signed)
1:31 PM  Date: 05/25/2012  Rate: 71  Rhythm: normal sinus rhythm  QRS Axis: normal  Intervals: normal QRS:  Poor R wave progression in precordial leads suggests possible old anterior myocardial infarction.  ST/T Wave abnormalities: normal  Conduction Disutrbances:none  Narrative Interpretation: Abnormal EKG  Old EKG Reviewed: unchanged    Carleene Cooper III, MD 05/25/12 [phone]

## 2012-05-25 NOTE — ED Notes (Signed)
The patient is dressed and ready for PTAR. The patient is still lying in the bed with the bed rails up and the call light within reach.

## 2012-05-25 NOTE — ED Notes (Signed)
PA-C at bedside 

## 2012-05-25 NOTE — ED Notes (Signed)
Several attempts made to call report to [facility] , no answer, only recording

## 2012-05-25 NOTE — ED Notes (Signed)
Family reports pt is not drinking, is dehydrated and has urinary symptoms x 1 week.

## 2012-05-25 NOTE — ED Provider Notes (Signed)
Medical screening examination/treatment/procedure(s) were conducted as a shared visit with non-physician practitioner(s) and myself.  I personally evaluated the patient during the encounter Elderly lady who has recently gone into a nursing home comes in with decreased urination, Hx drinking less than usual.  Exam shows her to be a frail elderly woman in no distress, heart and lungs clear, pleasantly demented.  Lab workup suggests mild dehydration.  Rehydrated and released.  Carleene Cooper III, MD 05/25/12 2240

## 2012-05-25 NOTE — ED Notes (Signed)
[facility] called for transport to [facility] 

## 2012-05-25 NOTE — ED Provider Notes (Signed)
History     CSN: [id number]  Arrival date & time 05/25/12  1033   First MD Initiated Contact with Patient 05/25/12 1151      Chief Complaint  Patient presents with  . Dehydration  . Urinary Tract Infection    (Consider location/radiation/quality/duration/timing/severity/associated sxs/prior treatment) Patient is a 78 y.o. female presenting with urinary tract infection.  Urinary Tract Infection Pertinent negatives include no fever or weakness.     78 y.o. female with dementia whose family states that she has been drinking less than normal and has had issues urinating for the past week. Patient states that she tries to urinate and nothing comes out. Denies dysuria,  Fever, nausea, vomiting, or any pain.   Past Medical History  Diagnosis Date  . Hypertension   . Thyroid disease   . Kidney stone   . Dementia     Past Surgical History  Procedure Date  . Kidney stone surgery     No family history on file.  History  Substance Use Topics  . Smoking status: Never Smoker   . Smokeless tobacco: Never Used  . Alcohol Use: No    OB History    Grav Para Term Preterm Abortions TAB SAB Ect Mult Living                  Review of Systems  Constitutional: Negative for fever.  Respiratory: Negative for shortness of breath.   Genitourinary: Negative for dysuria and flank pain.  Musculoskeletal: Negative for back pain.  Neurological: Negative for weakness.    Allergies  Review of patient's allergies indicates no known allergies.  Home Medications   Current Outpatient Rx  Name Route Sig Dispense Refill  . FOLIC ACID 400 MCG PO TABS Oral Take 400 mcg by mouth daily.    Marland Kitchen LEVOTHYROXINE SODIUM 50 MCG PO TABS Oral Take 50 mcg by mouth daily.    Marland Kitchen LISINOPRIL 20 MG PO TABS Oral Take 20 mg by mouth daily.      BP 161/57  Pulse 70  Temp 98.1 F (36.7 C) (Oral)  Resp 16  Ht 5\' 3"  (1.6 m)  Wt 115 lb (52.164 kg)  BMI 20.37 kg/m2  SpO2 97%  Physical Exam  Nursing note  and vitals reviewed. Constitutional: She is oriented to person, place, and time. She appears well-developed and well-nourished. No distress.       Appears frail  HENT:  Head: Normocephalic.  Eyes: Conjunctivae and EOM are normal.  Cardiovascular: Normal rate and regular rhythm.   Pulmonary/Chest: Effort normal and breath sounds normal. No respiratory distress. She has no wheezes. She has no rales.  Abdominal: Soft. Bowel sounds are normal. She exhibits no distension and no mass. There is tenderness. There is no rebound and no guarding.       Mild suprapubic tenderness. No CVA tenderness bilaterally  Musculoskeletal: Normal range of motion.  Neurological: She is alert and oriented to person, place, and time.  Skin: Skin is warm and dry.  Psychiatric: She has a normal mood and affect.    ED Course  Procedures (including critical care time)  Labs Reviewed  CBC WITH DIFFERENTIAL - Abnormal; Notable for the following:    RBC 3.42 (*)     Hemoglobin 10.7 (*)     HCT 31.9 (*)     All other components within normal limits  COMPREHENSIVE METABOLIC PANEL  URINALYSIS, ROUTINE W REFLEX MICROSCOPIC   No results found.   1. Dehydration, mild  MDM  78 year old female his family's concern for dehydration with urinary tract infection symptoms. Patient's creatinine is 1.5 and BUN is 33 this is an increase from her last creatinine level of 1.38 on 04/13/2012. UA and orthostatics pending  Urinalysis consistent with a urinary tract infection. Orthostatics show borderline dehydration. Likely cause of patient's difficulty urinating is through decreased by mouth intake. I will bolus 500 mL of normal saline.   Date: 05/25/2012  Rate:71  Rhythm: normal sinus rhythm  QRS Axis: normal  Intervals: normal  ST/T Wave abnormalities: nonspecific ST changes  Conduction Disutrbances:first-degree A-V block   Narrative Interpretation:   Old EKG Reviewed: unchanged  Shared visit with attending Dr.  Ignacia Palma.   Lung sounds clear after first 500 mL bolus,  I will bolus another 500 of normal saline and arrange for discharge. VSS, Pt is safe to dc.   Joni Reining Jeanene Mena, PA-C 05/25/12 1400

## 2012-06-06 ENCOUNTER — Emergency Department (HOSPITAL_BASED_OUTPATIENT_CLINIC_OR_DEPARTMENT_OTHER): Payer: Medicare Other

## 2012-06-06 ENCOUNTER — Encounter (HOSPITAL_BASED_OUTPATIENT_CLINIC_OR_DEPARTMENT_OTHER): Payer: Self-pay | Admitting: *Deleted

## 2012-06-06 ENCOUNTER — Emergency Department (HOSPITAL_BASED_OUTPATIENT_CLINIC_OR_DEPARTMENT_OTHER)
Admission: EM | Admit: 2012-06-06 | Discharge: 2012-06-06 | Disposition: A | Discharge status: Home / Self Care | Payer: Medicare Other | Attending: Emergency Medicine | Admitting: Emergency Medicine

## 2012-06-06 DIAGNOSIS — R600 Localized edema: Secondary | ICD-10-CM

## 2012-06-06 DIAGNOSIS — M25569 Pain in unspecified knee: Secondary | ICD-10-CM | POA: Insufficient documentation

## 2012-06-06 DIAGNOSIS — S9031XA Contusion of right foot, initial encounter: Secondary | ICD-10-CM

## 2012-06-06 DIAGNOSIS — F0390 Unspecified dementia without behavioral disturbance: Secondary | ICD-10-CM | POA: Insufficient documentation

## 2012-06-06 DIAGNOSIS — E079 Disorder of thyroid, unspecified: Secondary | ICD-10-CM | POA: Insufficient documentation

## 2012-06-06 DIAGNOSIS — I10 Essential (primary) hypertension: Secondary | ICD-10-CM | POA: Insufficient documentation

## 2012-06-06 DIAGNOSIS — S9030XA Contusion of unspecified foot, initial encounter: Secondary | ICD-10-CM | POA: Insufficient documentation

## 2012-06-06 DIAGNOSIS — X58XXXA Exposure to other specified factors, initial encounter: Secondary | ICD-10-CM | POA: Insufficient documentation

## 2012-06-06 DIAGNOSIS — R609 Edema, unspecified: Secondary | ICD-10-CM | POA: Insufficient documentation

## 2012-06-06 DIAGNOSIS — Y9210 Unspecified residential institution as the place of occurrence of the external cause: Secondary | ICD-10-CM | POA: Insufficient documentation

## 2012-06-06 NOTE — ED Notes (Signed)
Brought from [facility] nursing home with right foot swelling, bruising and thready pulse per staff. Last week her left knee was swollen and her MD diagnosed her with arthritis.

## 2012-06-06 NOTE — ED Provider Notes (Addendum)
History  This chart was scribed for Tonya Sprout, MD by Tonya Newman. This patient was seen in room MH12/MH12 and the patient's care was started at 3:14PM.  CSN: [id number]  Arrival date & time 06/06/12  1441   First MD Initiated Contact with Patient 06/06/12 1514     Level 5 Caveat-History of Dementia   Chief Complaint  Patient presents with  . Foot Swelling    The history is provided by a relative (daughter). No language interpreter was used.    Tonya Newman is a 78 y.o. female who presents to the Emergency Department from [facility] nursing home complaining of Left knee was swollen and sore last 1 week, knee was x-rayed at home, that friend reports was attributed to arthritis. Daughter reports that pt has been eating more food since moving into [facility]. Daughter states that staff reported that the pt "tore her room up last night". She has bruising and and mild swelling to the right foot. Daughter also reports that she was treated for bronchitis within the past two weeks as well.   Past Medical History  Diagnosis Date  . Hypertension   . Thyroid disease   . Kidney stone   . Dementia     Past Surgical History  Procedure Date  . Kidney stone surgery     No family history on file.  History  Substance Use Topics  . Smoking status: Never Smoker   . Smokeless tobacco: Never Used  . Alcohol Use: No    No OB provided history.  Review of Systems  Unable to perform ROS: Dementia    Allergies  Review of patient's allergies indicates no known allergies.  Home Medications   Current Outpatient Rx  Name Route Sig Dispense Refill  . FOLIC ACID 400 MCG PO TABS Oral Take 400 mcg by mouth daily.    Marland Kitchen LEVOTHYROXINE SODIUM 50 MCG PO TABS Oral Take 50 mcg by mouth daily.    Marland Kitchen LISINOPRIL 20 MG PO TABS Oral Take 20 mg by mouth daily.      Triage Vitals: BP 177/57  Pulse 72  Temp 98 F (36.7 C) (Oral)  Resp 18  Ht 5\' 4"  (1.626 m)  Wt 118 lb (53.524 kg)   BMI 20.25 kg/m2  SpO2 97%  Physical Exam  Nursing note and vitals reviewed. Constitutional: She appears well-developed and well-nourished. No distress.  HENT:  Head: Normocephalic and atraumatic.  Eyes: Conjunctivae and EOM are normal.  Neck: Neck supple. No tracheal deviation present.  Cardiovascular: Normal rate and regular rhythm.   Murmur (3/10 systolic murmur heard in the left sternal border) heard. Pulmonary/Chest: Effort normal and breath sounds normal. No respiratory distress. She has no wheezes. She has no rales.  Musculoskeletal: Normal range of motion.        2 + edema with warmth and tenderness to the touch in the left calf, 1+ edema at the right ankle and ecchymosis to the lateral aspect of the right dorsal foot, thready pulse in bilateral lower extremities, <2 sec capillary refill bilaterally  Neurological: She is alert.  Skin: Skin is warm and dry.  Psychiatric: She has a normal mood and affect. Her behavior is normal.    ED Course  Procedures (including critical care time)  DIAGNOSTIC STUDIES: Oxygen Saturation is 97% on room air, adequate by my interpretation.    COORDINATION OF CARE: 3:23PM-Discussed treatment plan which includes a doppler US with daughter at bedside and daughter agreed to plan.  Labs  Reviewed - No data to display US Venous Img Lower Unilateral Left  06/06/2012  *RADIOLOGY REPORT*  Clinical Data: r/o DVT;; LEFT KNEE PAIN, SWELLING.  LEFT LOWER EXTREMITY VENOUS DUPLEX ULTRASOUND  Technique: Gray-scale sonography with compression, as well as color and duplex ultrasound, were performed to evaluate the deep venous system from the level of the common femoral vein through the popliteal and proximal calf veins.  Comparison: None  Findings:  Normal compressibility and normal Doppler signal within the common femoral, superficial femoral and popliteal veins, down to the proximal calf veins.  No grayscale filling defects to suggest DVT.  IMPRESSION: No evidence  of left lower extremity deep vein thrombosis.   Original Report Authenticated By: Cyndie Chime, M.D.    Dg Foot Complete Right  06/06/2012  *RADIOLOGY REPORT*  Clinical Data: Dropped object on foot.  Left foot pain, bruising, and swelling.  RIGHT FOOT COMPLETE - 3+ VIEW  Comparison: None.  Findings: No evidence of acute fracture or dislocation.  Moderate to severe hallux valgus is seen with bony bunion formation.  No other significant bone abnormality identified.  IMPRESSION:  1.  No acute findings. 2.  Moderate to severe hallux valgus with bony bunion.   Original Report Authenticated By: Danae Orleans, M.D.      1. Contusion of right foot   2. Lower extremity edema       MDM   Patient with 2 separate complaints today. Initially patient with left knee pain that's developed and left leg pain and swelling for the last one week. Patient has significant swelling in the left leg compared to the right and warmth to palpation. Patient had a plain film of her knee done one week ago and was told she had arthritis. However her symptoms are worsening and concern for possible DVT. Doppler of the left leg pending to rule out DVT.  No sx suggestive of CHF or PE.  Secondly patient unwilling to walk on her right foot today due to pain. Patient has recently moved into [facility] nursing home and last night was up moving about in her room unattended and possibility that she could have dropped something on her foot were fallen. Foot is ecchymotic and tender to palpation. Right foot is cool compared to the left however feel that left has acute pathology. Pulses are decreased bilaterally but equal and less than 2 second capillary refill. No sign of arterial occlusion at this time.  Plain film of the right foot.   4:41 PM DVT study was negative. Patient did have dopplerable pulses in bilateral lower sternum and knees there is fainter on the right. Plain film of the right foot neg.  No acute cause for her lower  ext edema or knee/leg pain.  Discussed results with pt and daughter.  Pt had recent labs 1 week ago with normal Cr and UA neg.  With unchanged CBC.  No explaination for swelling other than new diet and recent abx.   I personally performed the services described in this documentation, which was scribed in my presence.  The recorded information has been reviewed and considered.    Tonya Sprout, MD 06/06/12 1538  Tonya Sprout, MD 06/06/12 1549  Tonya Sprout, MD 06/06/12 1642  Tonya Sprout, MD 06/06/12 1646  Tonya Sprout, MD 06/06/12 1610  Tonya Sprout, MD 06/06/12 1717

## 2012-06-17 ENCOUNTER — Encounter (HOSPITAL_COMMUNITY)
Admission: RE | Admit: 2012-06-17 | Discharge: 2012-06-17 | Disposition: A | Discharge status: Home / Self Care | Payer: Medicare Other | Source: Ambulatory Visit | Attending: Nephrology | Admitting: Nephrology

## 2012-06-17 DIAGNOSIS — D638 Anemia in other chronic diseases classified elsewhere: Secondary | ICD-10-CM | POA: Insufficient documentation

## 2012-06-17 DIAGNOSIS — N184 Chronic kidney disease, stage 4 (severe): Secondary | ICD-10-CM | POA: Insufficient documentation

## 2012-06-17 LAB — RENAL FUNCTION PANEL
BUN: 36 mg/dL — ABNORMAL HIGH (ref 6–23)
CO2: 27 mEq/L (ref 19–32)
Calcium: 9.3 mg/dL (ref 8.4–10.5)
Creatinine, Ser: 1.7 mg/dL — ABNORMAL HIGH (ref 0.50–1.10)
GFR calc Af Amer: 28 mL/min — ABNORMAL LOW (ref >90)
Glucose, Bld: 154 mg/dL — ABNORMAL HIGH (ref 70–99)

## 2012-06-17 LAB — IRON AND TIBC
Saturation Ratios: 28 % (ref 20–55)
UIBC: 176 ug/dL (ref 125–400)

## 2012-06-17 MED ORDER — DARBEPOETIN ALFA-POLYSORBATE 500 MCG/ML IJ SOLN
100.0000 ug | INTRAMUSCULAR | Status: DC
  Filled 2012-06-17: qty 1

## 2012-06-17 MED ORDER — DARBEPOETIN ALFA-POLYSORBATE 100 MCG/0.5ML IJ SOLN
INTRAMUSCULAR | Status: AC
  Administered 2012-06-17: 100 ug via SUBCUTANEOUS
  Filled 2012-06-17: qty 0.5

## 2012-07-26 ENCOUNTER — Other Ambulatory Visit (HOSPITAL_COMMUNITY): Payer: Self-pay | Admitting: *Deleted

## 2012-07-27 ENCOUNTER — Encounter (HOSPITAL_COMMUNITY)
Admission: RE | Admit: 2012-07-27 | Discharge: 2012-07-27 | Disposition: A | Discharge status: Home / Self Care | Payer: Medicare Other | Source: Ambulatory Visit | Attending: Nephrology | Admitting: Nephrology

## 2012-07-27 DIAGNOSIS — D638 Anemia in other chronic diseases classified elsewhere: Secondary | ICD-10-CM | POA: Insufficient documentation

## 2012-07-27 DIAGNOSIS — N184 Chronic kidney disease, stage 4 (severe): Secondary | ICD-10-CM | POA: Insufficient documentation

## 2012-07-27 LAB — IRON AND TIBC
Iron: 53 ug/dL (ref 42–135)
Saturation Ratios: 24 % (ref 20–55)
TIBC: 223 ug/dL — ABNORMAL LOW (ref 250–470)
UIBC: 170 ug/dL (ref 125–400)

## 2012-07-27 LAB — FERRITIN: Ferritin: 163 ng/mL (ref 10–291)

## 2012-07-27 MED ORDER — DARBEPOETIN ALFA-POLYSORBATE 100 MCG/0.5ML IJ SOLN
INTRAMUSCULAR | Status: AC
  Administered 2012-07-27: 100 ug via SUBCUTANEOUS
  Filled 2012-07-27: qty 0.5

## 2012-07-27 MED ORDER — DARBEPOETIN ALFA-POLYSORBATE 500 MCG/ML IJ SOLN
100.0000 ug | INTRAMUSCULAR | Status: DC
  Filled 2012-07-27: qty 1

## 2012-09-07 ENCOUNTER — Encounter (HOSPITAL_COMMUNITY)
Admission: RE | Admit: 2012-09-07 | Discharge: 2012-09-07 | Disposition: A | Discharge status: Home / Self Care | Payer: Medicare Other | Source: Ambulatory Visit | Attending: Nephrology | Admitting: Nephrology

## 2012-09-07 DIAGNOSIS — N184 Chronic kidney disease, stage 4 (severe): Secondary | ICD-10-CM | POA: Insufficient documentation

## 2012-09-07 DIAGNOSIS — D638 Anemia in other chronic diseases classified elsewhere: Secondary | ICD-10-CM | POA: Insufficient documentation

## 2012-09-07 LAB — RENAL FUNCTION PANEL
Albumin: 3.3 g/dL — ABNORMAL LOW (ref 3.5–5.2)
BUN: 35 mg/dL — ABNORMAL HIGH (ref 6–23)
Calcium: 9.3 mg/dL (ref 8.4–10.5)
Creatinine, Ser: 1.51 mg/dL — ABNORMAL HIGH (ref 0.50–1.10)
Phosphorus: 4.5 mg/dL (ref 2.3–4.6)

## 2012-09-07 LAB — FERRITIN: Ferritin: 133 ng/mL (ref 10–291)

## 2012-09-07 LAB — IRON AND TIBC: TIBC: 253 ug/dL (ref 250–470)

## 2012-09-07 LAB — POCT HEMOGLOBIN-HEMACUE: Hemoglobin: 10.5 g/dL — ABNORMAL LOW (ref 12.0–15.0)

## 2012-09-07 MED ORDER — DARBEPOETIN ALFA-POLYSORBATE 500 MCG/ML IJ SOLN
100.0000 ug | INTRAMUSCULAR | Status: DC
  Filled 2012-09-07: qty 1

## 2012-09-07 MED ORDER — DARBEPOETIN ALFA-POLYSORBATE 100 MCG/0.5ML IJ SOLN
INTRAMUSCULAR | Status: AC
  Administered 2012-09-07: 100 ug
  Filled 2012-09-07: qty 0.5

## 2012-10-18 ENCOUNTER — Other Ambulatory Visit (HOSPITAL_COMMUNITY): Payer: Self-pay | Admitting: *Deleted

## 2012-10-20 ENCOUNTER — Encounter (HOSPITAL_COMMUNITY): Payer: Medicare Other

## 2012-11-02 ENCOUNTER — Encounter (HOSPITAL_COMMUNITY)
Admission: RE | Admit: 2012-11-02 | Discharge: 2012-11-02 | Disposition: A | Discharge status: Home / Self Care | Payer: Medicare Other | Source: Ambulatory Visit | Attending: Nephrology | Admitting: Nephrology

## 2012-11-02 DIAGNOSIS — D638 Anemia in other chronic diseases classified elsewhere: Secondary | ICD-10-CM | POA: Insufficient documentation

## 2012-11-02 DIAGNOSIS — N184 Chronic kidney disease, stage 4 (severe): Secondary | ICD-10-CM | POA: Insufficient documentation

## 2012-11-02 LAB — RENAL FUNCTION PANEL
Albumin: 3.2 g/dL — ABNORMAL LOW (ref 3.5–5.2)
BUN: 38 mg/dL — ABNORMAL HIGH (ref 6–23)
Chloride: 104 mEq/L (ref 96–112)
Creatinine, Ser: 1.5 mg/dL — ABNORMAL HIGH (ref 0.50–1.10)
GFR calc non Af Amer: 28 mL/min — ABNORMAL LOW (ref >90)
Phosphorus: 3.2 mg/dL (ref 2.3–4.6)

## 2012-11-02 LAB — FERRITIN: Ferritin: 213 ng/mL (ref 10–291)

## 2012-11-02 LAB — POCT HEMOGLOBIN-HEMACUE: Hemoglobin: 10.2 g/dL — ABNORMAL LOW (ref 12.0–15.0)

## 2012-11-02 MED ORDER — DARBEPOETIN ALFA-POLYSORBATE 500 MCG/ML IJ SOLN
100.0000 ug | INTRAMUSCULAR | Status: DC
  Filled 2012-11-02: qty 1

## 2012-11-02 MED ORDER — DARBEPOETIN ALFA-POLYSORBATE 100 MCG/0.5ML IJ SOLN
INTRAMUSCULAR | Status: AC
  Administered 2012-11-02: 100 ug via SUBCUTANEOUS
  Filled 2012-11-02: qty 0.5

## 2012-12-14 ENCOUNTER — Encounter (HOSPITAL_COMMUNITY): Payer: Medicare Other

## 2012-12-19 ENCOUNTER — Encounter (HOSPITAL_COMMUNITY): Payer: Medicare Other

## 2012-12-22 ENCOUNTER — Other Ambulatory Visit (HOSPITAL_COMMUNITY): Payer: Self-pay | Admitting: *Deleted

## 2012-12-23 ENCOUNTER — Encounter (HOSPITAL_COMMUNITY): Payer: Medicare Other

## 2012-12-26 ENCOUNTER — Encounter (HOSPITAL_COMMUNITY)
Admission: RE | Admit: 2012-12-26 | Discharge: 2012-12-26 | Disposition: A | Discharge status: Home / Self Care | Payer: Medicare Other | Source: Ambulatory Visit | Attending: Nephrology | Admitting: Nephrology

## 2012-12-26 DIAGNOSIS — D638 Anemia in other chronic diseases classified elsewhere: Secondary | ICD-10-CM | POA: Insufficient documentation

## 2012-12-26 DIAGNOSIS — N184 Chronic kidney disease, stage 4 (severe): Secondary | ICD-10-CM | POA: Insufficient documentation

## 2012-12-26 LAB — RENAL FUNCTION PANEL
Albumin: 3.3 g/dL — ABNORMAL LOW (ref 3.5–5.2)
CO2: 26 mEq/L (ref 19–32)
Chloride: 105 mEq/L (ref 96–112)
GFR calc Af Amer: 31 mL/min — ABNORMAL LOW (ref >90)
GFR calc non Af Amer: 27 mL/min — ABNORMAL LOW (ref >90)
Potassium: 4.5 mEq/L (ref 3.5–5.1)
Sodium: 142 mEq/L (ref 135–145)

## 2012-12-26 LAB — IRON AND TIBC
Iron: 84 ug/dL (ref 42–135)
Saturation Ratios: 35 % (ref 20–55)
TIBC: 239 ug/dL — ABNORMAL LOW (ref 250–470)

## 2012-12-26 LAB — POCT HEMOGLOBIN-HEMACUE: Hemoglobin: 10.6 g/dL — ABNORMAL LOW (ref 12.0–15.0)

## 2012-12-26 MED ORDER — DARBEPOETIN ALFA-POLYSORBATE 100 MCG/0.5ML IJ SOLN
INTRAMUSCULAR | Status: AC
  Administered 2012-12-26: 100 ug via SUBCUTANEOUS
  Filled 2012-12-26: qty 0.5

## 2012-12-26 MED ORDER — DARBEPOETIN ALFA-POLYSORBATE 500 MCG/ML IJ SOLN
100.0000 ug | INTRAMUSCULAR | Status: DC
  Filled 2012-12-26: qty 1

## 2013-03-03 ENCOUNTER — Encounter (HOSPITAL_COMMUNITY)
Admission: RE | Admit: 2013-03-03 | Discharge: 2013-03-03 | Disposition: A | Discharge status: Home / Self Care | Payer: Medicare Other | Source: Ambulatory Visit | Attending: Nephrology | Admitting: Nephrology

## 2013-03-03 DIAGNOSIS — N184 Chronic kidney disease, stage 4 (severe): Secondary | ICD-10-CM | POA: Insufficient documentation

## 2013-03-03 DIAGNOSIS — I129 Hypertensive chronic kidney disease with stage 1 through stage 4 chronic kidney disease, or unspecified chronic kidney disease: Secondary | ICD-10-CM | POA: Insufficient documentation

## 2013-03-03 DIAGNOSIS — D631 Anemia in chronic kidney disease: Secondary | ICD-10-CM | POA: Insufficient documentation

## 2013-03-03 LAB — RENAL FUNCTION PANEL
Albumin: 3.6 g/dL (ref 3.5–5.2)
BUN: 37 mg/dL — ABNORMAL HIGH (ref 6–23)
CO2: 29 mEq/L (ref 19–32)
Chloride: 106 mEq/L (ref 96–112)
Glucose, Bld: 127 mg/dL — ABNORMAL HIGH (ref 70–99)
Potassium: 4.4 mEq/L (ref 3.5–5.1)

## 2013-03-03 LAB — IRON AND TIBC
Iron: 54 ug/dL (ref 42–135)
Saturation Ratios: 22 % (ref 20–55)
TIBC: 250 ug/dL (ref 250–470)

## 2013-03-03 LAB — FERRITIN: Ferritin: 158 ng/mL (ref 10–291)

## 2013-03-03 MED ORDER — DARBEPOETIN ALFA-POLYSORBATE 100 MCG/0.5ML IJ SOLN
INTRAMUSCULAR | Status: AC
  Administered 2013-03-03: 100 ug via SUBCUTANEOUS
  Filled 2013-03-03: qty 0.5

## 2013-03-23 ENCOUNTER — Other Ambulatory Visit (HOSPITAL_COMMUNITY): Payer: Self-pay | Admitting: *Deleted

## 2013-03-24 ENCOUNTER — Encounter (HOSPITAL_COMMUNITY): Payer: Medicare Other

## 2013-04-12 ENCOUNTER — Encounter (HOSPITAL_COMMUNITY)
Admission: RE | Admit: 2013-04-12 | Discharge: 2013-04-12 | Disposition: A | Discharge status: Home / Self Care | Payer: Medicare Other | Source: Ambulatory Visit | Attending: Nephrology | Admitting: Nephrology

## 2013-04-12 DIAGNOSIS — D631 Anemia in chronic kidney disease: Secondary | ICD-10-CM | POA: Insufficient documentation

## 2013-04-12 DIAGNOSIS — N184 Chronic kidney disease, stage 4 (severe): Secondary | ICD-10-CM | POA: Insufficient documentation

## 2013-04-12 DIAGNOSIS — I129 Hypertensive chronic kidney disease with stage 1 through stage 4 chronic kidney disease, or unspecified chronic kidney disease: Secondary | ICD-10-CM | POA: Insufficient documentation

## 2013-04-12 LAB — RENAL FUNCTION PANEL
BUN: 40 mg/dL — ABNORMAL HIGH (ref 6–23)
CO2: 28 mEq/L (ref 19–32)
GFR calc Af Amer: 26 mL/min — ABNORMAL LOW (ref >90)
Glucose, Bld: 100 mg/dL — ABNORMAL HIGH (ref 70–99)
Potassium: 4.2 mEq/L (ref 3.5–5.1)
Sodium: 143 mEq/L (ref 135–145)

## 2013-04-12 LAB — IRON AND TIBC
Iron: 63 ug/dL (ref 42–135)
Saturation Ratios: 28 % (ref 20–55)
TIBC: 224 ug/dL — ABNORMAL LOW (ref 250–470)
UIBC: 161 ug/dL (ref 125–400)

## 2013-04-12 LAB — FERRITIN: Ferritin: 141 ng/mL (ref 10–291)

## 2013-04-12 MED ORDER — DARBEPOETIN ALFA-POLYSORBATE 500 MCG/ML IJ SOLN
100.0000 ug | INTRAMUSCULAR | Status: DC
  Filled 2013-04-12: qty 1

## 2013-04-12 MED ORDER — DARBEPOETIN ALFA-POLYSORBATE 100 MCG/0.5ML IJ SOLN
INTRAMUSCULAR | Status: AC
  Administered 2013-04-12: 100 ug via SUBCUTANEOUS
  Filled 2013-04-12: qty 0.5

## 2013-04-12 MED ORDER — FERUMOXYTOL INJECTION 510 MG/17 ML
1020.0000 mg | Freq: Once | INTRAVENOUS | Status: AC
  Administered 2013-04-12: 1020 mg via INTRAVENOUS
  Filled 2013-04-12: qty 34

## 2013-05-18 ENCOUNTER — Encounter (HOSPITAL_COMMUNITY)
Admission: RE | Admit: 2013-05-18 | Discharge: 2013-05-18 | Disposition: A | Discharge status: Home / Self Care | Payer: Medicare Other | Source: Ambulatory Visit | Attending: Nephrology | Admitting: Nephrology

## 2013-05-18 DIAGNOSIS — N039 Chronic nephritic syndrome with unspecified morphologic changes: Secondary | ICD-10-CM | POA: Insufficient documentation

## 2013-05-18 DIAGNOSIS — D631 Anemia in chronic kidney disease: Secondary | ICD-10-CM | POA: Insufficient documentation

## 2013-05-18 DIAGNOSIS — N184 Chronic kidney disease, stage 4 (severe): Secondary | ICD-10-CM | POA: Insufficient documentation

## 2013-05-18 DIAGNOSIS — I129 Hypertensive chronic kidney disease with stage 1 through stage 4 chronic kidney disease, or unspecified chronic kidney disease: Secondary | ICD-10-CM | POA: Insufficient documentation

## 2013-05-18 MED ORDER — DARBEPOETIN ALFA-POLYSORBATE 500 MCG/ML IJ SOLN
100.0000 ug | INTRAMUSCULAR | Status: DC
  Filled 2013-05-18: qty 1

## 2013-05-18 MED ORDER — DARBEPOETIN ALFA-POLYSORBATE 100 MCG/0.5ML IJ SOLN
INTRAMUSCULAR | Status: AC
  Administered 2013-05-18: 100 ug via SUBCUTANEOUS
  Filled 2013-05-18: qty 0.5

## 2013-06-28 ENCOUNTER — Other Ambulatory Visit (HOSPITAL_COMMUNITY): Payer: Self-pay

## 2013-06-29 ENCOUNTER — Encounter (HOSPITAL_COMMUNITY)
Admission: RE | Admit: 2013-06-29 | Discharge: 2013-06-29 | Disposition: A | Discharge status: Home / Self Care | Payer: Medicare Other | Source: Ambulatory Visit | Attending: Nephrology | Admitting: Nephrology

## 2013-06-29 DIAGNOSIS — D631 Anemia in chronic kidney disease: Secondary | ICD-10-CM | POA: Insufficient documentation

## 2013-06-29 DIAGNOSIS — I129 Hypertensive chronic kidney disease with stage 1 through stage 4 chronic kidney disease, or unspecified chronic kidney disease: Secondary | ICD-10-CM | POA: Insufficient documentation

## 2013-06-29 DIAGNOSIS — N184 Chronic kidney disease, stage 4 (severe): Secondary | ICD-10-CM | POA: Insufficient documentation

## 2013-06-29 LAB — RENAL FUNCTION PANEL
BUN: 37 mg/dL — ABNORMAL HIGH (ref 6–23)
CO2: 28 mEq/L (ref 19–32)
Calcium: 9.4 mg/dL (ref 8.4–10.5)
Creatinine, Ser: 1.53 mg/dL — ABNORMAL HIGH (ref 0.50–1.10)
GFR calc Af Amer: 31 mL/min — ABNORMAL LOW (ref >90)
Glucose, Bld: 114 mg/dL — ABNORMAL HIGH (ref 70–99)
Phosphorus: 4.3 mg/dL (ref 2.3–4.6)
Sodium: 142 mEq/L (ref 135–145)

## 2013-06-29 LAB — IRON AND TIBC
Saturation Ratios: 30 % (ref 20–55)
UIBC: 154 ug/dL (ref 125–400)

## 2013-06-29 LAB — FERRITIN: Ferritin: 462 ng/mL — ABNORMAL HIGH (ref 10–291)

## 2013-06-29 MED ORDER — DARBEPOETIN ALFA-POLYSORBATE 500 MCG/ML IJ SOLN: 100.0000 ug | INTRAMUSCULAR | Status: DC

## 2013-07-13 ENCOUNTER — Encounter (HOSPITAL_COMMUNITY)
Admission: RE | Admit: 2013-07-13 | Discharge: 2013-07-13 | Disposition: A | Discharge status: Home / Self Care | Payer: Medicare Other | Source: Ambulatory Visit | Attending: Nephrology | Admitting: Nephrology

## 2013-07-13 MED ORDER — DARBEPOETIN ALFA-POLYSORBATE 500 MCG/ML IJ SOLN
100.0000 ug | INTRAMUSCULAR | Status: DC
  Filled 2013-07-13: qty 1

## 2013-07-13 MED ORDER — DARBEPOETIN ALFA-POLYSORBATE 100 MCG/0.5ML IJ SOLN
INTRAMUSCULAR | Status: AC
  Administered 2013-07-13: 100 ug
  Filled 2013-07-13: qty 0.5

## 2013-08-24 ENCOUNTER — Encounter (HOSPITAL_COMMUNITY): Payer: Medicare Other

## 2013-09-06 ENCOUNTER — Other Ambulatory Visit (HOSPITAL_COMMUNITY): Payer: Self-pay | Admitting: *Deleted

## 2013-09-07 ENCOUNTER — Encounter (HOSPITAL_COMMUNITY)
Admission: RE | Admit: 2013-09-07 | Discharge: 2013-09-07 | Disposition: A | Discharge status: Home / Self Care | Payer: Medicare Other | Source: Ambulatory Visit | Attending: Nephrology | Admitting: Nephrology

## 2013-09-07 DIAGNOSIS — N184 Chronic kidney disease, stage 4 (severe): Secondary | ICD-10-CM | POA: Insufficient documentation

## 2013-09-07 DIAGNOSIS — D631 Anemia in chronic kidney disease: Secondary | ICD-10-CM | POA: Insufficient documentation

## 2013-09-07 DIAGNOSIS — I129 Hypertensive chronic kidney disease with stage 1 through stage 4 chronic kidney disease, or unspecified chronic kidney disease: Secondary | ICD-10-CM | POA: Insufficient documentation

## 2013-09-07 LAB — IRON AND TIBC
Iron: 77 ug/dL (ref 42–135)
Saturation Ratios: 34 % (ref 20–55)
TIBC: 226 ug/dL — ABNORMAL LOW (ref 250–470)
UIBC: 149 ug/dL (ref 125–400)

## 2013-09-07 LAB — RENAL FUNCTION PANEL
Albumin: 3.8 g/dL (ref 3.5–5.2)
BUN: 36 mg/dL — ABNORMAL HIGH (ref 6–23)
Calcium: 9.7 mg/dL (ref 8.4–10.5)
Chloride: 105 mEq/L (ref 96–112)
Glucose, Bld: 84 mg/dL (ref 70–99)
Potassium: 4.5 mEq/L (ref 3.5–5.1)

## 2013-09-07 LAB — FERRITIN: Ferritin: 563 ng/mL — ABNORMAL HIGH (ref 10–291)

## 2013-09-07 MED ORDER — DARBEPOETIN ALFA-POLYSORBATE 500 MCG/ML IJ SOLN
100.0000 ug | INTRAMUSCULAR | Status: DC
  Filled 2013-09-07: qty 1

## 2013-09-07 MED ORDER — DARBEPOETIN ALFA-POLYSORBATE 100 MCG/0.5ML IJ SOLN
INTRAMUSCULAR | Status: AC
  Administered 2013-09-07: 100 ug via SUBCUTANEOUS
  Filled 2013-09-07: qty 0.5

## 2013-10-26 ENCOUNTER — Encounter (HOSPITAL_COMMUNITY): Payer: Medicare Other

## 2013-11-01 ENCOUNTER — Other Ambulatory Visit (HOSPITAL_COMMUNITY): Payer: Self-pay | Admitting: *Deleted

## 2013-11-02 ENCOUNTER — Encounter (HOSPITAL_COMMUNITY): Payer: Medicare Other

## 2013-11-10 ENCOUNTER — Emergency Department (HOSPITAL_BASED_OUTPATIENT_CLINIC_OR_DEPARTMENT_OTHER): Payer: Medicare Other

## 2013-11-10 ENCOUNTER — Emergency Department (HOSPITAL_BASED_OUTPATIENT_CLINIC_OR_DEPARTMENT_OTHER)
Admission: EM | Admit: 2013-11-10 | Discharge: 2013-11-10 | Disposition: A | Discharge status: Home / Self Care | Payer: Medicare Other | Attending: Emergency Medicine | Admitting: Emergency Medicine

## 2013-11-10 ENCOUNTER — Encounter (HOSPITAL_BASED_OUTPATIENT_CLINIC_OR_DEPARTMENT_OTHER): Payer: Self-pay | Admitting: Emergency Medicine

## 2013-11-10 DIAGNOSIS — Z87442 Personal history of urinary calculi: Secondary | ICD-10-CM | POA: Insufficient documentation

## 2013-11-10 DIAGNOSIS — Z7902 Long term (current) use of antithrombotics/antiplatelets: Secondary | ICD-10-CM | POA: Insufficient documentation

## 2013-11-10 DIAGNOSIS — Y92009 Unspecified place in unspecified non-institutional (private) residence as the place of occurrence of the external cause: Secondary | ICD-10-CM | POA: Insufficient documentation

## 2013-11-10 DIAGNOSIS — E079 Disorder of thyroid, unspecified: Secondary | ICD-10-CM | POA: Insufficient documentation

## 2013-11-10 DIAGNOSIS — Z79899 Other long term (current) drug therapy: Secondary | ICD-10-CM | POA: Insufficient documentation

## 2013-11-10 DIAGNOSIS — R011 Cardiac murmur, unspecified: Secondary | ICD-10-CM | POA: Insufficient documentation

## 2013-11-10 DIAGNOSIS — I10 Essential (primary) hypertension: Secondary | ICD-10-CM | POA: Insufficient documentation

## 2013-11-10 DIAGNOSIS — Y9389 Activity, other specified: Secondary | ICD-10-CM | POA: Insufficient documentation

## 2013-11-10 DIAGNOSIS — W1809XA Striking against other object with subsequent fall, initial encounter: Secondary | ICD-10-CM | POA: Insufficient documentation

## 2013-11-10 DIAGNOSIS — S0101XA Laceration without foreign body of scalp, initial encounter: Secondary | ICD-10-CM

## 2013-11-10 DIAGNOSIS — S0100XA Unspecified open wound of scalp, initial encounter: Secondary | ICD-10-CM | POA: Insufficient documentation

## 2013-11-10 DIAGNOSIS — F0390 Unspecified dementia without behavioral disturbance: Secondary | ICD-10-CM | POA: Insufficient documentation

## 2013-11-10 DIAGNOSIS — W06XXXA Fall from bed, initial encounter: Secondary | ICD-10-CM | POA: Insufficient documentation

## 2013-11-10 MED ORDER — AMLODIPINE BESYLATE 5 MG PO TABS
5.0000 mg | ORAL_TABLET | Freq: Once | ORAL | Status: AC
  Administered 2013-11-10: 5 mg via ORAL
  Filled 2013-11-10: qty 1

## 2013-11-10 MED ORDER — ACETAMINOPHEN 325 MG PO TABS
325.0000 mg | ORAL_TABLET | Freq: Once | ORAL | Status: AC
  Administered 2013-11-10: 325 mg via ORAL

## 2013-11-10 MED ORDER — ACETAMINOPHEN 325 MG PO TABS
650.0000 mg | ORAL_TABLET | Freq: Once | ORAL | Status: AC
  Administered 2013-11-10: 650 mg via ORAL
  Filled 2013-11-10: qty 2

## 2013-11-10 MED ORDER — LIDOCAINE-EPINEPHRINE-TETRACAINE (LET) SOLUTION
NASAL | Status: AC
  Administered 2013-11-10: 3 mL
  Filled 2013-11-10: qty 3

## 2013-11-10 MED ORDER — ACETAMINOPHEN 325 MG PO TABS
ORAL_TABLET | ORAL | Status: AC
  Filled 2013-11-10: qty 1

## 2013-11-10 NOTE — ED Notes (Signed)
Pt got out of bed and slipped on floor,  Hit head on night  stand,  No loc,

## 2013-11-10 NOTE — ED Notes (Signed)
Placed hospital socks on Pts feet

## 2013-11-10 NOTE — ED Notes (Signed)
Arrived ems,  Fell at home, hit back of head, heamatoma noted,  Denies loc  Bleeding controlled

## 2013-11-10 NOTE — ED Provider Notes (Signed)
CSN: [id number]     Arrival date & time 11/10/13  0551 History   None    Chief Complaint  Patient presents with  . Fall   (Consider location/radiation/quality/duration/timing/severity/associated sxs/prior Treatment) Patient is a 80 y.o. female presenting with fall. The history is provided by the EMS personnel and a relative. The history is limited by the condition of the patient (dementia). No language interpreter was used.  Fall This is a new problem. The current episode started less than 1 hour ago. The problem occurs rarely. The problem has been resolved. Nothing aggravates the symptoms. Nothing relieves the symptoms. She has tried nothing for the symptoms. The treatment provided no relief.  Larey Seat out of bed and hit head on night stand per son, states the floor is slippery and she does not have her walker  Past Medical History  Diagnosis Date  . Hypertension   . Thyroid disease   . Kidney stone   . Dementia    Past Surgical History  Procedure Laterality Date  . Kidney stone surgery     No family history on file. History  Substance Use Topics  . Smoking status: Never Smoker   . Smokeless tobacco: Never Used  . Alcohol Use: No   OB History   Grav Para Term Preterm Abortions TAB SAB Ect Mult Living                 Review of Systems  Unable to perform ROS   Allergies  Review of patient's allergies indicates no known allergies.  Home Medications   Current Outpatient Rx  Name  Route  Sig  Dispense  Refill  . clopidogrel (PLAVIX) 75 MG tablet   Oral   Take 75 mg by mouth daily.         . folic acid (FOLVITE) 400 MCG tablet   Oral   Take 400 mcg by mouth daily.         Marland Kitchen levothyroxine (SYNTHROID, LEVOTHROID) 50 MCG tablet   Oral   Take 50 mcg by mouth daily.         Marland Kitchen lisinopril (PRINIVIL,ZESTRIL) 20 MG tablet   Oral   Take 20 mg by mouth daily.          There were no vitals taken for this visit. Physical Exam  Constitutional: She appears  well-developed and well-nourished. No distress.  HENT:  Head: Head is without raccoon's eyes and without Battle's sign.    Right Ear: No hemotympanum.  Left Ear: No hemotympanum.  Mouth/Throat: Oropharynx is clear and moist.  Eyes: Conjunctivae and EOM are normal. Pupils are equal, round, and reactive to light.  Neck: Normal range of motion. Neck supple.  Cardiovascular: Normal rate, regular rhythm and intact distal pulses.   Murmur heard.  Systolic murmur is present with a grade of 3/6  Pulmonary/Chest: Effort normal and breath sounds normal. She has no wheezes. She has no rales.  Abdominal: Soft. Bowel sounds are normal. There is no tenderness. There is no rebound and no guarding.  Musculoskeletal: Normal range of motion. She exhibits no edema and no tenderness.  Neurological: She is alert.  Skin: Skin is warm and dry.  Psychiatric: She has a normal mood and affect.    ED Course  Procedures (including critical care time) Labs Review Labs Reviewed - No data to display Imaging Review No results found.  EKG Interpretation   None       MDM  No diagnosis found. LACERATION REPAIR Performed by: Deanna Artis  K Authorized by: Jasmine AwePALUMBO-RASCH,Keylani Perlstein K Consent: Verbal consent obtained. Risks and benefits: risks, benefits and alternatives were discussed Consent given by: patient Patient identity confirmed: provided demographic data Prepped and Draped in normal sterile fashion Wound explored  Laceration Location: scalp posterior  Laceration Length: 1 cm  No Foreign Bodies seen or palpated  Anesthesia: topical LET   Irrigation method: syringe Amount of cleaning: standard  Skin closure: staples  Number of sutures: 3  Technique: staples  Patient tolerance: Patient tolerated the procedure well with no immediate complications.     Jasmine AweApril K Felma Pfefferle-Rasch, MD 11/10/13 [phone]

## 2013-11-10 NOTE — ED Provider Notes (Signed)
Patient is normally hypertensive and has not had her am meds.  Suspect her BP med is not appropriate and will need adjustment.  Son informed patient must follow up with her PMD for medication adjustment.  He verbalizes understanding and agrees to follow up  Jemal Miskell Smitty CordsK Eddie Payette-Rasch, MD 11/10/13 [id number]

## 2013-11-30 ENCOUNTER — Encounter (HOSPITAL_COMMUNITY)
Admission: RE | Admit: 2013-11-30 | Discharge: 2013-11-30 | Disposition: A | Discharge status: Home / Self Care | Payer: Medicare Other | Source: Ambulatory Visit | Attending: Nephrology | Admitting: Nephrology

## 2013-11-30 DIAGNOSIS — N184 Chronic kidney disease, stage 4 (severe): Secondary | ICD-10-CM | POA: Insufficient documentation

## 2013-11-30 DIAGNOSIS — N039 Chronic nephritic syndrome with unspecified morphologic changes: Principal | ICD-10-CM

## 2013-11-30 DIAGNOSIS — I129 Hypertensive chronic kidney disease with stage 1 through stage 4 chronic kidney disease, or unspecified chronic kidney disease: Secondary | ICD-10-CM | POA: Insufficient documentation

## 2013-11-30 DIAGNOSIS — D631 Anemia in chronic kidney disease: Secondary | ICD-10-CM | POA: Insufficient documentation

## 2013-11-30 LAB — RENAL FUNCTION PANEL
Albumin: 3.6 g/dL (ref 3.5–5.2)
BUN: 41 mg/dL — ABNORMAL HIGH (ref 6–23)
CALCIUM: 9.3 mg/dL (ref 8.4–10.5)
CHLORIDE: 105 meq/L (ref 96–112)
CO2: 25 mEq/L (ref 19–32)
Creatinine, Ser: 1.81 mg/dL — ABNORMAL HIGH (ref 0.50–1.10)
GFR, EST AFRICAN AMERICAN: 25 mL/min — AB (ref >90)
GFR, EST NON AFRICAN AMERICAN: 22 mL/min — AB (ref >90)
GLUCOSE: 120 mg/dL — AB (ref 70–99)
POTASSIUM: 4.4 meq/L (ref 3.7–5.3)
Phosphorus: 5.1 mg/dL — ABNORMAL HIGH (ref 2.3–4.6)
SODIUM: 146 meq/L (ref 137–147)

## 2013-11-30 LAB — IRON AND TIBC
IRON: 50 ug/dL (ref 42–135)
Saturation Ratios: 21 % (ref 20–55)
TIBC: 240 ug/dL — ABNORMAL LOW (ref 250–470)
UIBC: 190 ug/dL (ref 125–400)

## 2013-11-30 LAB — POCT HEMOGLOBIN-HEMACUE: Hemoglobin: 10.6 g/dL — ABNORMAL LOW (ref 12.0–15.0)

## 2013-11-30 LAB — FERRITIN: Ferritin: 481 ng/mL — ABNORMAL HIGH (ref 10–291)

## 2013-11-30 MED ORDER — EPOETIN ALFA 20000 UNIT/ML IJ SOLN
INTRAMUSCULAR | Status: AC
  Filled 2013-11-30: qty 1

## 2013-11-30 MED ORDER — EPOETIN ALFA 20000 UNIT/ML IJ SOLN
20000.0000 [IU] | INTRAMUSCULAR | Status: DC
  Administered 2013-11-30: 13:00:00 20000 [IU] via SUBCUTANEOUS

## 2013-12-14 ENCOUNTER — Encounter (HOSPITAL_COMMUNITY): Payer: Medicare Other

## 2013-12-18 ENCOUNTER — Encounter (HOSPITAL_COMMUNITY)
Admission: RE | Admit: 2013-12-18 | Discharge: 2013-12-18 | Disposition: A | Discharge status: Home / Self Care | Payer: Medicare Other | Source: Ambulatory Visit | Attending: Nephrology | Admitting: Nephrology

## 2013-12-18 DIAGNOSIS — N184 Chronic kidney disease, stage 4 (severe): Secondary | ICD-10-CM | POA: Insufficient documentation

## 2013-12-18 DIAGNOSIS — D631 Anemia in chronic kidney disease: Secondary | ICD-10-CM | POA: Insufficient documentation

## 2013-12-18 DIAGNOSIS — N039 Chronic nephritic syndrome with unspecified morphologic changes: Principal | ICD-10-CM

## 2013-12-18 DIAGNOSIS — I129 Hypertensive chronic kidney disease with stage 1 through stage 4 chronic kidney disease, or unspecified chronic kidney disease: Secondary | ICD-10-CM | POA: Insufficient documentation

## 2013-12-18 LAB — POCT HEMOGLOBIN-HEMACUE: Hemoglobin: 10.8 g/dL — ABNORMAL LOW (ref 12.0–15.0)

## 2013-12-18 MED ORDER — EPOETIN ALFA 20000 UNIT/ML IJ SOLN
20000.0000 [IU] | INTRAMUSCULAR | Status: DC
  Administered 2013-12-18: 20000 [IU] via SUBCUTANEOUS

## 2013-12-18 MED ORDER — EPOETIN ALFA 20000 UNIT/ML IJ SOLN
INTRAMUSCULAR | Status: AC
  Filled 2013-12-18: qty 1

## 2014-01-03 ENCOUNTER — Encounter (HOSPITAL_COMMUNITY)
Admission: RE | Admit: 2014-01-03 | Discharge: 2014-01-03 | Disposition: A | Discharge status: Home / Self Care | Payer: Medicare Other | Source: Ambulatory Visit | Attending: Nephrology | Admitting: Nephrology

## 2014-01-03 LAB — RENAL FUNCTION PANEL
Albumin: 3.3 g/dL — ABNORMAL LOW (ref 3.5–5.2)
BUN: 37 mg/dL — AB (ref 6–23)
CALCIUM: 9.1 mg/dL (ref 8.4–10.5)
CO2: 27 mEq/L (ref 19–32)
Chloride: 103 mEq/L (ref 96–112)
Creatinine, Ser: 1.6 mg/dL — ABNORMAL HIGH (ref 0.50–1.10)
GFR calc Af Amer: 29 mL/min — ABNORMAL LOW (ref >90)
GFR calc non Af Amer: 25 mL/min — ABNORMAL LOW (ref >90)
GLUCOSE: 102 mg/dL — AB (ref 70–99)
Phosphorus: 4.5 mg/dL (ref 2.3–4.6)
Potassium: 3.8 mEq/L (ref 3.7–5.3)
Sodium: 142 mEq/L (ref 137–147)

## 2014-01-03 LAB — POCT HEMOGLOBIN-HEMACUE: Hemoglobin: 11.8 g/dL — ABNORMAL LOW (ref 12.0–15.0)

## 2014-01-03 LAB — FERRITIN: FERRITIN: 381 ng/mL — AB (ref 10–291)

## 2014-01-03 LAB — IRON AND TIBC
Iron: 31 ug/dL — ABNORMAL LOW (ref 42–135)
Saturation Ratios: 15 % — ABNORMAL LOW (ref 20–55)
TIBC: 201 ug/dL — ABNORMAL LOW (ref 250–470)
UIBC: 170 ug/dL (ref 125–400)

## 2014-01-03 MED ORDER — EPOETIN ALFA 20000 UNIT/ML IJ SOLN
20000.0000 [IU] | INTRAMUSCULAR | Status: DC
  Administered 2014-01-03: 20000 [IU] via SUBCUTANEOUS

## 2014-01-03 MED ORDER — EPOETIN ALFA 20000 UNIT/ML IJ SOLN
INTRAMUSCULAR | Status: AC
  Filled 2014-01-03: qty 1

## 2014-01-12 ENCOUNTER — Emergency Department (HOSPITAL_COMMUNITY): Payer: Medicare Other

## 2014-01-12 ENCOUNTER — Inpatient Hospital Stay (HOSPITAL_COMMUNITY): Payer: Medicare Other

## 2014-01-12 ENCOUNTER — Inpatient Hospital Stay (HOSPITAL_COMMUNITY)
Admission: EM | Admit: 2014-01-12 | Discharge: 2014-01-15 | DRG: 065 | Disposition: A | Discharge status: Skilled Nursing Facility | Payer: Medicare Other | Attending: Internal Medicine | Admitting: Internal Medicine

## 2014-01-12 ENCOUNTER — Encounter (HOSPITAL_COMMUNITY): Payer: Self-pay | Admitting: Emergency Medicine

## 2014-01-12 DIAGNOSIS — F03918 Unspecified dementia, unspecified severity, with other behavioral disturbance: Secondary | ICD-10-CM | POA: Diagnosis present

## 2014-01-12 DIAGNOSIS — G8190 Hemiplegia, unspecified affecting unspecified side: Secondary | ICD-10-CM

## 2014-01-12 DIAGNOSIS — K219 Gastro-esophageal reflux disease without esophagitis: Secondary | ICD-10-CM | POA: Diagnosis present

## 2014-01-12 DIAGNOSIS — I6350 Cerebral infarction due to unspecified occlusion or stenosis of unspecified cerebral artery: Principal | ICD-10-CM

## 2014-01-12 DIAGNOSIS — Z8673 Personal history of transient ischemic attack (TIA), and cerebral infarction without residual deficits: Secondary | ICD-10-CM

## 2014-01-12 DIAGNOSIS — R471 Dysarthria and anarthria: Secondary | ICD-10-CM | POA: Diagnosis present

## 2014-01-12 DIAGNOSIS — Z66 Do not resuscitate: Secondary | ICD-10-CM | POA: Diagnosis not present

## 2014-01-12 DIAGNOSIS — F0391 Unspecified dementia with behavioral disturbance: Secondary | ICD-10-CM

## 2014-01-12 DIAGNOSIS — E039 Hypothyroidism, unspecified: Secondary | ICD-10-CM | POA: Diagnosis present

## 2014-01-12 DIAGNOSIS — I10 Essential (primary) hypertension: Secondary | ICD-10-CM | POA: Diagnosis present

## 2014-01-12 DIAGNOSIS — E785 Hyperlipidemia, unspecified: Secondary | ICD-10-CM | POA: Diagnosis present

## 2014-01-12 DIAGNOSIS — I639 Cerebral infarction, unspecified: Secondary | ICD-10-CM

## 2014-01-12 HISTORY — DX: Gastro-esophageal reflux disease without esophagitis: K21.9

## 2014-01-12 HISTORY — DX: Anemia, unspecified: D64.9

## 2014-01-12 LAB — URINALYSIS, ROUTINE W REFLEX MICROSCOPIC
Bilirubin Urine: NEGATIVE
GLUCOSE, UA: NEGATIVE mg/dL
Ketones, ur: NEGATIVE mg/dL
Leukocytes, UA: NEGATIVE
Nitrite: NEGATIVE
PH: 7 (ref 5.0–8.0)
Protein, ur: 100 mg/dL — AB
SPECIFIC GRAVITY, URINE: 1.009 (ref 1.005–1.030)
Urobilinogen, UA: 0.2 mg/dL (ref 0.0–1.0)

## 2014-01-12 LAB — CBC
HCT: 41.2 % (ref 36.0–46.0)
Hemoglobin: 14.2 g/dL (ref 12.0–15.0)
MCH: 32.1 pg (ref 26.0–34.0)
MCHC: 34.5 g/dL (ref 30.0–36.0)
MCV: 93.2 fL (ref 78.0–100.0)
Platelets: 193 10*3/uL (ref 150–400)
RBC: 4.42 MIL/uL (ref 3.87–5.11)
RDW: 14 % (ref 11.5–15.5)
WBC: 5.3 10*3/uL (ref 4.0–10.5)

## 2014-01-12 LAB — DIFFERENTIAL
Basophils Absolute: 0 10*3/uL (ref 0.0–0.1)
Basophils Relative: 1 % (ref 0–1)
EOS PCT: 2 % (ref 0–5)
Eosinophils Absolute: 0.1 10*3/uL (ref 0.0–0.7)
Lymphocytes Relative: 39 % (ref 12–46)
Lymphs Abs: 2 10*3/uL (ref 0.7–4.0)
Monocytes Absolute: 0.5 10*3/uL (ref 0.1–1.0)
Monocytes Relative: 10 % (ref 3–12)
NEUTROS ABS: 2.6 10*3/uL (ref 1.7–7.7)
Neutrophils Relative %: 49 % (ref 43–77)

## 2014-01-12 LAB — COMPREHENSIVE METABOLIC PANEL
ALT: 15 U/L (ref 0–35)
AST: 24 U/L (ref 0–37)
Albumin: 3.7 g/dL (ref 3.5–5.2)
Alkaline Phosphatase: 89 U/L (ref 39–117)
BUN: 39 mg/dL — ABNORMAL HIGH (ref 6–23)
CALCIUM: 9.5 mg/dL (ref 8.4–10.5)
CO2: 23 mEq/L (ref 19–32)
Chloride: 102 mEq/L (ref 96–112)
Creatinine, Ser: 1.5 mg/dL — ABNORMAL HIGH (ref 0.50–1.10)
GFR calc Af Amer: 32 mL/min — ABNORMAL LOW (ref >90)
GFR, EST NON AFRICAN AMERICAN: 27 mL/min — AB (ref >90)
Glucose, Bld: 119 mg/dL — ABNORMAL HIGH (ref 70–99)
Potassium: 4.2 mEq/L (ref 3.7–5.3)
Sodium: 143 mEq/L (ref 137–147)
Total Bilirubin: 0.3 mg/dL (ref 0.3–1.2)
Total Protein: 7 g/dL (ref 6.0–8.3)

## 2014-01-12 LAB — URINE MICROSCOPIC-ADD ON

## 2014-01-12 LAB — CBG MONITORING, ED: GLUCOSE-CAPILLARY: 112 mg/dL — AB (ref 70–99)

## 2014-01-12 LAB — I-STAT CHEM 8, ED
BUN: 48 mg/dL — AB (ref 6–23)
CREATININE: 1.7 mg/dL — AB (ref 0.50–1.10)
Calcium, Ion: 1.06 mmol/L — ABNORMAL LOW (ref 1.13–1.30)
Chloride: 104 mEq/L (ref 96–112)
Glucose, Bld: 120 mg/dL — ABNORMAL HIGH (ref 70–99)
HCT: 44 % (ref 36.0–46.0)
Hemoglobin: 15 g/dL (ref 12.0–15.0)
POTASSIUM: 4 meq/L (ref 3.7–5.3)
SODIUM: 141 meq/L (ref 137–147)
TCO2: 27 mmol/L (ref 0–100)

## 2014-01-12 LAB — APTT: APTT: 31 s (ref 24–37)

## 2014-01-12 LAB — I-STAT TROPONIN, ED: Troponin i, poc: 0.01 ng/mL (ref 0.00–0.08)

## 2014-01-12 LAB — PROTIME-INR
INR: 0.89 (ref 0.00–1.49)
PROTHROMBIN TIME: 11.9 s (ref 11.6–15.2)

## 2014-01-12 MED ORDER — ASPIRIN 325 MG PO TABS
325.0000 mg | ORAL_TABLET | Freq: Every day | ORAL | Status: DC
  Filled 2014-01-12 (×3): qty 1

## 2014-01-12 MED ORDER — ASPIRIN 300 MG RE SUPP
300.0000 mg | Freq: Every day | RECTAL | Status: DC
Start: 2014-01-12 — End: 2014-01-15
  Administered 2014-01-12 – 2014-01-14 (×3): 300 mg via RECTAL
  Filled 2014-01-12 (×4): qty 1

## 2014-01-12 MED ORDER — LABETALOL HCL 5 MG/ML IV SOLN
10.0000 mg | Freq: Once | INTRAVENOUS | Status: AC
  Administered 2014-01-12: 10 mg via INTRAVENOUS
  Filled 2014-01-12: qty 4

## 2014-01-12 MED ORDER — SODIUM CHLORIDE 0.9 % IV SOLN
INTRAVENOUS | Status: DC
  Administered 2014-01-12: 23:00:00 via INTRAVENOUS
  Administered 2014-01-13: 75 mL/h via INTRAVENOUS
  Administered 2014-01-14: 05:00:00 via INTRAVENOUS
  Administered 2014-01-14: 75 mL/h via INTRAVENOUS

## 2014-01-12 MED ORDER — LABETALOL HCL 5 MG/ML IV SOLN
20.0000 mg | Freq: Once | INTRAVENOUS | Status: AC
  Administered 2014-01-12: 20 mg via INTRAVENOUS
  Filled 2014-01-12: qty 4

## 2014-01-12 MED ORDER — DONEPEZIL HCL 5 MG PO TABS
5.0000 mg | ORAL_TABLET | Freq: Every day | ORAL | Status: DC
  Administered 2014-01-13 – 2014-01-14 (×2): 5 mg via ORAL
  Filled 2014-01-12 (×4): qty 1

## 2014-01-12 MED ORDER — LORAZEPAM 0.5 MG PO TABS: 0.5000 mg | ORAL_TABLET | Freq: Three times a day (TID) | ORAL | Status: DC | PRN

## 2014-01-12 MED ORDER — SENNOSIDES-DOCUSATE SODIUM 8.6-50 MG PO TABS
1.0000 | ORAL_TABLET | Freq: Every evening | ORAL | Status: DC | PRN
  Filled 2014-01-12: qty 1

## 2014-01-12 MED ORDER — ENOXAPARIN SODIUM 30 MG/0.3ML ~~LOC~~ SOLN
30.0000 mg | Freq: Every day | SUBCUTANEOUS | Status: DC
  Administered 2014-01-12 – 2014-01-14 (×3): 30 mg via SUBCUTANEOUS
  Filled 2014-01-12 (×4): qty 0.3

## 2014-01-12 MED ORDER — LISINOPRIL 5 MG PO TABS
5.0000 mg | ORAL_TABLET | Freq: Every day | ORAL | Status: DC
  Administered 2014-01-14: 5 mg via ORAL
  Filled 2014-01-12 (×3): qty 1

## 2014-01-12 MED ORDER — ALBUTEROL SULFATE (2.5 MG/3ML) 0.083% IN NEBU: 2.5000 mg | INHALATION_SOLUTION | Freq: Four times a day (QID) | RESPIRATORY_TRACT | Status: DC | PRN

## 2014-01-12 MED ORDER — LEVOTHYROXINE SODIUM 50 MCG PO TABS
50.0000 ug | ORAL_TABLET | Freq: Every day | ORAL | Status: DC
  Filled 2014-01-12 (×2): qty 1

## 2014-01-12 MED ORDER — CLOPIDOGREL BISULFATE 75 MG PO TABS
75.0000 mg | ORAL_TABLET | Freq: Every day | ORAL | Status: DC
  Administered 2014-01-14: 75 mg via ORAL
  Filled 2014-01-12 (×2): qty 1

## 2014-01-12 MED ORDER — ALBUTEROL SULFATE HFA 108 (90 BASE) MCG/ACT IN AERS: 2.0000 | INHALATION_SPRAY | Freq: Four times a day (QID) | RESPIRATORY_TRACT | Status: DC | PRN

## 2014-01-12 NOTE — H&P (Addendum)
Hospitalist Admission History and Physical  Patient name: Tonya Newman Medical record number: [id number] Date of birth: August 01, 1911 Age: 80 y.o. Gender: female  Primary Care Provider: Angelica Chessman., MD  Chief Complaint: CVA History of Present Illness:This is a 80 y.o. year old female with prior hx/o HTN, dementia presenting with CVA. Pt is resident of [facility]. Per report, pt with noted sudden onset of L sided weakness and slurred speech around 6:30. Pt is otherwise ambulatory with a rolling walker per son. Son had visited with pt earlier in the day and was otherwise at ambulatory baseline. EMS was called Code stroke called at time of arrival in ER. Initial NIH stroke scale score of 16. Initial head CT negative for any acute intracranial abnormality, though with old r thalamic lacunar infarct. BPs noted 200s/100s. tPa was initially discussed by neuro, however was refused by family. Recs were for in patient admission for stroke eval. Pt noted to be on plavix prior to admission. However, son denies any prior hx/o CAD or CVA in the past. Pt started on full dose ASA.  Patient Active Problem List   Diagnosis Date Noted  . CVA (cerebral infarction) 01/12/2014  . Hemiplegia, unspecified, affecting nondominant side 01/12/2014  . Stroke 01/12/2014   Past Medical History: Past Medical History  Diagnosis Date  . Hypertension   . Thyroid disease   . Kidney stone   . Dementia   . GERD (gastroesophageal reflux disease)   . Anemia     Past Surgical History: Past Surgical History  Procedure Laterality Date  . Kidney stone surgery      Social History: History   Social History  . Marital Status: Widowed    Spouse Name: N/A    Number of Children: N/A  . Years of Education: N/A   Social History Main Topics  . Smoking status: Never Smoker   . Smokeless tobacco: Never Used  . Alcohol Use: No  . Drug Use: No  . Sexual Activity: None   Other Topics Concern  . None    Social History Narrative  . None    Family History: History reviewed. No pertinent family history.  Allergies: Allergies  Allergen Reactions  . Ciprofloxacin Other (See Comments)    unknown  . Seroquel [Quetiapine] Other (See Comments)    unknown    Current Facility-Administered Medications  Medication Dose Route Frequency Provider Last Rate Last Dose  . albuterol (PROVENTIL HFA;VENTOLIN HFA) 108 (90 BASE) MCG/ACT inhaler 2 puff  2 puff Inhalation Q6H PRN Doree Albee, MD      . aspirin suppository 300 mg  300 mg Rectal Daily Doree Albee, MD       Or  . aspirin tablet 325 mg  325 mg Oral Daily Doree Albee, MD      . clopidogrel (PLAVIX) tablet 75 mg  75 mg Oral Daily Doree Albee, MD      . donepezil (ARICEPT) tablet 5 mg  5 mg Oral QHS Doree Albee, MD      . enoxaparin (LOVENOX) injection 30 mg  30 mg Subcutaneous Q24H Doree Albee, MD      . levothyroxine (SYNTHROID, LEVOTHROID) tablet 50 mcg  50 mcg Oral Daily Doree Albee, MD      . lisinopril (PRINIVIL,ZESTRIL) tablet 5 mg  5 mg Oral Daily Doree Albee, MD      . LORazepam (ATIVAN) tablet 0.5 mg  0.5 mg Oral Q8H PRN Doree Albee, MD      . senna-docusate (Senokot-S)  tablet 1 tablet  1 tablet Oral QHS PRN Doree AlbeeSteven Theseus Birnie, MD       Current Outpatient Prescriptions  Medication Sig Dispense Refill  . albuterol (PROVENTIL HFA;VENTOLIN HFA) 108 (90 BASE) MCG/ACT inhaler Inhale 2 puffs into the lungs every 6 (six) hours as needed for wheezing or shortness of breath.      . clopidogrel (PLAVIX) 75 MG tablet Take 75 mg by mouth daily.      Marland Kitchen. donepezil (ARICEPT) 5 MG tablet Take 5 mg by mouth at bedtime.      Marland Kitchen. levothyroxine (SYNTHROID, LEVOTHROID) 50 MCG tablet Take 50 mcg by mouth daily.      Marland Kitchen. lisinopril (PRINIVIL,ZESTRIL) 5 MG tablet Take 5 mg by mouth daily.      Marland Kitchen. LORazepam (ATIVAN) 0.5 MG tablet Take 0.5 mg by mouth every 8 (eight) hours as needed for anxiety.       Review Of Systems: 12 point ROS negative  except as noted above in HPI.  Physical Exam: Filed Vitals:   01/12/14 2046  BP: 203/94  Pulse: 67  Temp:   Resp: 20    General: mildly alert, combative  HEENT: PERRLA and extra ocular movement intact Heart: S1, S2 normal, no murmur, rub or gallop, regular rate and rhythm Lungs: clear to auscultation, no wheezes or rales and unlabored breathing Abdomen: abdomen is soft without significant tenderness, masses, organomegaly or guarding Extremities: L sided hemiparesis, grip strength, sensation, ROM intact on R  Skin:no rashes, no ecchymoses Neurology: L sided weakness and neglect, sensation intact,  Labs and Imaging: Lab Results  Component Value Date/Time   NA 141 01/12/2014  7:18 PM   K 4.0 01/12/2014  7:18 PM   CL 104 01/12/2014  7:18 PM   CO2 23 01/12/2014  7:10 PM   BUN 48* 01/12/2014  7:18 PM   CREATININE 1.70* 01/12/2014  7:18 PM   GLUCOSE 120* 01/12/2014  7:18 PM   Lab Results  Component Value Date   WBC 5.3 01/12/2014   HGB 15.0 01/12/2014   HCT 44.0 01/12/2014   MCV 93.2 01/12/2014   PLT 193 01/12/2014    Ct Head (brain) Wo Contrast  01/12/2014   CLINICAL DATA:  Code stroke, left-sided weakness  EXAM: CT HEAD WITHOUT CONTRAST  TECHNIQUE: Contiguous axial images were obtained from the base of the skull through the vertex without intravenous contrast.  COMPARISON:  11/10/2013  FINDINGS: No evidence of parenchymal hemorrhage or extra-axial fluid collection. No mass lesion, mass effect, or midline shift.  No CT evidence of acute infarction.  Old right thalamic lacunar infarct (series 2/ image 16).  Subcortical white matter and periventricular small vessel ischemic changes. Intracranial atherosclerosis.  Age related cortical atrophy.  No ventriculomegaly.  The visualized paranasal sinuses are essentially clear. The mastoid air cells are unopacified.  No evidence of calvarial fracture.  IMPRESSION: No evidence of acute intracranial abnormality.  Old right thalamic lacunar infarct.  Small  vessel ischemic changes with intracranial atherosclerosis.  These results were called by telephone at the time of interpretation on 01/12/2014 at 7:20 PM to Dr. Thad Rangereynolds, who verbally acknowledged these results.   Electronically Signed   By: Charline BillsSriyesh  Krishnan M.D.   On: 01/12/2014 19:22     Assessment and Plan: Sofie Rowerhelma T Hoeppner is a [age] y.o. year old female presenting with CVA   CVA: Will proceed down stroke workup including MRI/MRA, 2D ECHO, carotid dopplers, risk stratification labs including A1C, TSH, lipid panel. Start on full dose ASA. Continue plavix.  Neuro consulted. Appreciate input.   HTN: Accelerated. Continue ACE. Prn hydralazine. Would like to avoid > 25% drop in BP as to exacerbate baseline neuro status. Tele bed   CKD: stage 4-5 on GFR. Likely chronic issue. Cr at baseline in comparison to recent labs. Gently hydrate and reassess.   FEN/GI: NPO pending stroke swallow screen   Prophylaxis: lovenox  Disposition: pending further evaluation  Code Status:Full Code        Doree Albee MD  Pager: [phone]

## 2014-01-12 NOTE — Code Documentation (Signed)
[age] [facility] onset Lt side weakness & slurred speech.  [facility], pt was last seen normal at 1815 when she was eating a sandwich & was found 15-20 min later with speech deficit & Lt side weakness.  NIH 16 see doc flowsheets for code stroke times.  Pt's son declined tPA

## 2014-01-12 NOTE — Consult Note (Signed)
Referring Physician: Pollina    Chief Complaint: Right sided weakness, slurred speech  HPI: Tonya Newman is an 80 y.o. female who was noted to be at baseline at the facility earlier today.  Asked for a sandwich and was able to start eating it normally.  When her aid returned patient was noted to have slurred speech and was not moving her left side well.  EMS was called and the patient was brought in as a code stroke.  Patient is on Plavix daily.   At baseline patient has dementia but is able to communicate normally and ambulates with a walker.  Date last known well: Date: 01/12/2014 Time last known well: Time: 18:15 tPA Given: No: Family does not wish tPA to be given  Past Medical History  Diagnosis Date  . Hypertension   . Thyroid disease   . Kidney stone   . Dementia   . GERD (gastroesophageal reflux disease)   . Anemia     Past Surgical History  Procedure Laterality Date  . Kidney stone surgery      Family history:  Son reports that all children are alive and well.  Neither the son nor the patient can give any history about the parents of the patient.    Social History:  reports that she has never smoked. She has never used smokeless tobacco. She reports that she does not drink alcohol or use illicit drugs.  Allergies:  Allergies  Allergen Reactions  . Ciprofloxacin Other (See Comments)    unknown  . Seroquel [Quetiapine] Other (See Comments)    unknown    Medications: I have reviewed the patient's current medications. Prior to Admission:  Current outpatient prescriptions: albuterol (PROVENTIL HFA;VENTOLIN HFA) 108 (90 BASE) MCG/ACT inhaler, Inhale 2 puffs into the lungs every 6 (six) hours as needed for wheezing or shortness of breath., Disp: , Rfl: ;   clopidogrel (PLAVIX) 75 MG tablet, Take 75 mg by mouth daily., Disp: , Rfl: ;  donepezil (ARICEPT) 5 MG tablet, Take 5 mg by mouth at bedtime., Disp: , Rfl:  levothyroxine (SYNTHROID, LEVOTHROID) 50 MCG tablet,  Take 50 mcg by mouth daily., Disp: , Rfl: ;   lisinopril (PRINIVIL,ZESTRIL) 5 MG tablet, Take 5 mg by mouth daily., Disp: , Rfl: ;   LORazepam (ATIVAN) 0.5 MG tablet, Take 0.5 mg by mouth every 8 (eight) hours as needed for anxiety., Disp: , Rfl:   ROS: History obtained from the patient  General ROS: negative for - chills, fatigue, fever, night sweats, weight gain or weight loss Psychological ROS: negative for - behavioral disorder, hallucinations, memory difficulties, mood swings or suicidal ideation Ophthalmic ROS: negative for - blurry vision, double vision, eye pain or loss of vision ENT ROS: negative for - epistaxis, nasal discharge, oral lesions, sore throat, tinnitus or vertigo Allergy and Immunology ROS: negative for - hives or itchy/watery eyes Hematological and Lymphatic ROS: negative for - bleeding problems, bruising or swollen lymph nodes Endocrine ROS: negative for - galactorrhea, hair pattern changes, polydipsia/polyuria or temperature intolerance Respiratory ROS: negative for - cough, hemoptysis, shortness of breath or wheezing Cardiovascular ROS: negative for - chest pain, dyspnea on exertion, edema or irregular heartbeat Gastrointestinal ROS: negative for - abdominal pain, diarrhea, hematemesis, nausea/vomiting or stool incontinence Genito-Urinary ROS: negative for - dysuria, hematuria, incontinence or urinary frequency/urgency Musculoskeletal ROS: negative for - joint swelling or muscular weakness Neurological ROS: as noted in HPI Dermatological ROS: negative for rash and skin lesion changes  Physical Examination: Blood pressure  208/104, pulse 74, temperature 97.5 F (36.4 C), temperature source Oral, resp. rate 20, weight 49.9 kg (110 lb 0.2 oz), SpO2 94.00%.  Neurologic Examination: Mental Status: Alert.  Patient unable to give age or date.  Does not know where she is.  Follows some simple commands but is poorly cooperative.  Speech fluent but slurred.   Cranial  Nerves: II: Discs flat bilaterally; Patient does not blink to confrontation from the left.  Pupils equal, round, reactive to light and accommodation III,IV, VI: ptosis not present, Right gaze preference-unable to go beyond midline to the left.  V,VII: left facial droop, facial light touch sensation normal bilaterally VIII: hearing normal bilaterally IX,X: gag reflex reduced XI: shoulder shrug decreased on the left XII: midline tongue extension Motor: Right : Upper extremity   5/5    Left:     Upper extremity   2-3/5  Lower extremity   5/5     Lower extremity   4-/5 Tone and bulk:normal tone throughout; no atrophy noted Sensory: Pinprick and light touch intact throughout, bilaterally Deep Tendon Reflexes: 2+ and symmetric throughout Plantars: Right: upgoing   Left: upgoing Cerebellar: normal finger-to-nose and normal heel-to-shin test Gait: Unable to test CV: pulses palpable throughout     Laboratory Studies:  Basic Metabolic Panel:  Recent Labs Lab 01/12/14 1910 01/12/14 1918  NA 143 141  K 4.2 4.0  CL 102 104  CO2 23  --   GLUCOSE 119* 120*  BUN 39* 48*  CREATININE 1.50* 1.70*  CALCIUM 9.5  --     Liver Function Tests:  Recent Labs Lab 01/12/14 1910  AST 24  ALT 15  ALKPHOS 89  BILITOT 0.3  PROT 7.0  ALBUMIN 3.7   No results found for this basename: LIPASE, AMYLASE,  in the last 168 hours No results found for this basename: AMMONIA,  in the last 168 hours  CBC:  Recent Labs Lab 01/12/14 1910 01/12/14 1918  WBC 5.3  --   NEUTROABS 2.6  --   HGB 14.2 15.0  HCT 41.2 44.0  MCV 93.2  --   PLT 193  --     Cardiac Enzymes: No results found for this basename: CKTOTAL, CKMB, CKMBINDEX, TROPONINI,  in the last 168 hours  BNP: No components found with this basename: POCBNP,   CBG:  Recent Labs Lab 01/12/14 1920  GLUCAP 112*    Microbiology: Results for orders placed during the hospital encounter of 10/30/11  URINE CULTURE     Status: None    Collection Time    10/30/11  7:41 PM      Result Value Ref Range Status   Specimen Description URINE, CLEAN CATCH   Final   Special Requests NONE   Final   Culture  Setup Time [id number]   Final   Colony Count NO GROWTH   Final   Culture NO GROWTH   Final   Report Status 11/01/2011 FINAL   Final    Coagulation Studies:  Recent Labs  01/12/14 1910  LABPROT 11.9  INR 0.89    Urinalysis: No results found for this basename: COLORURINE, APPERANCEUR, LABSPEC, PHURINE, GLUCOSEU, HGBUR, BILIRUBINUR, KETONESUR, PROTEINUR, UROBILINOGEN, NITRITE, LEUKOCYTESUR,  in the last 168 hours  Lipid Panel:    Component Value Date/Time   CHOL  Value: 205        ATP III CLASSIFICATION:  <200     mg/dL   Desirable  811-914200-239  mg/dL   Borderline High  >=782>=240  mg/dL   High       * 6/57/8469 0523   TRIG 134 11/09/2010 0523   HDL 43 11/09/2010 0523   CHOLHDL 4.8 11/09/2010 0523   VLDL 27 11/09/2010 0523   LDLCALC  Value: 135        Total Cholesterol/HDL:CHD Risk Coronary Heart Disease Risk Table                     Men   Women  1/2 Average Risk   3.4   3.3  Average Risk       5.0   4.4  2 X Average Risk   9.6   7.1  3 X Average Risk  23.4   11.0        Use the calculated Patient Ratio above and the CHD Risk Table to determine the patient's CHD Risk.        ATP III CLASSIFICATION (LDL):  <100     mg/dL   Optimal  629-528  mg/dL   Near or Above                    Optimal  130-159  mg/dL   Borderline  413-244  mg/dL   High  >010     mg/dL   Very High* 2/72/5366 0523    HgbA1C:  No results found for this basename: HGBA1C    Urine Drug Screen:     Component Value Date/Time   LABOPIA NONE DETECTED 11/20/2011 1844   COCAINSCRNUR NONE DETECTED 11/20/2011 1844   LABBENZ NONE DETECTED 11/20/2011 1844   AMPHETMU NONE DETECTED 11/20/2011 1844   THCU NONE DETECTED 11/20/2011 1844   LABBARB NONE DETECTED 11/20/2011 1844    Alcohol Level: No results found for this basename: ETH,  in the last 168 hours  Other results: EKG:  sinus rhythm at 86 bpm.  Imaging: Ct Head (brain) Wo Contrast  01/12/2014   CLINICAL DATA:  Code stroke, left-sided weakness  EXAM: CT HEAD WITHOUT CONTRAST  TECHNIQUE: Contiguous axial images were obtained from the base of the skull through the vertex without intravenous contrast.  COMPARISON:  11/10/2013  FINDINGS: No evidence of parenchymal hemorrhage or extra-axial fluid collection. No mass lesion, mass effect, or midline shift.  No CT evidence of acute infarction.  Old right thalamic lacunar infarct (series 2/ image 16).  Subcortical white matter and periventricular small vessel ischemic changes. Intracranial atherosclerosis.  Age related cortical atrophy.  No ventriculomegaly.  The visualized paranasal sinuses are essentially clear. The mastoid air cells are unopacified.  No evidence of calvarial fracture.  IMPRESSION: No evidence of acute intracranial abnormality.  Old right thalamic lacunar infarct.  Small vessel ischemic changes with intracranial atherosclerosis.  These results were called by telephone at the time of interpretation on 01/12/2014 at 7:20 PM to Dr. Thad Ranger, who verbally acknowledged these results.   Electronically Signed   By: Charline Bills M.D.   On: 01/12/2014 19:22    Assessment: 80 y.o. female presenting with acute onset slurred speech, left hemiparesis and LHH.  Blood pressure markedly elevated on presentation as well.  Suspect acute infarct.  Head CT reviewed and shows no acute changes. Discussion had with family concerning treatment plans.  tPA refused.  Patient to be admitted for stroke work up and therapy.    Stroke Risk Factors - hypertension  Plan: 1. HgbA1c, fasting lipid panel 2. MRI, MRA  of the brain without contrast 3. PT consult, OT consult, Speech consult 4.  Echocardiogram 5. Carotid dopplers 6. Prophylactic therapy-Antiplatelet med: Aspirin - dose 300mg  rectally 7. BP control - 20mg  Labetalol given stat 8. Telemetry monitoring 9. Frequent neuro  checks  Case discussed with Dr. Annie Paras, MD [facility] [phone] 01/12/2014, 8:05 PM

## 2014-01-12 NOTE — ED Notes (Signed)
Per [facility] - pt from [facility] - pt reported to have acute onset of slurred speech and left sided weakness that began approx 1815 per facility staff. On arrival pt alert and oriented to person.

## 2014-01-12 NOTE — ED Provider Notes (Signed)
CSN: [id number]     Arrival date & time 01/12/14  1902 History   First MD Initiated Contact with Patient 01/12/14 1904     Chief Complaint  Patient presents with  . Code Stroke     (Consider location/radiation/quality/duration/timing/severity/associated sxs/prior Treatment) Patient is a 80 y.o. female presenting with Acute Neurological Problem. The history is provided by the patient and the EMS personnel. No language interpreter was used.  Cerebrovascular Accident This is a new problem. The current episode started today (1.5 hours ago noticed at SNF by aide). The problem occurs constantly. The problem has been unchanged. Associated symptoms include weakness. Pertinent negatives include no abdominal pain, chest pain, congestion, coughing, diaphoresis, fever or vomiting. Nothing aggravates the symptoms. She has tried nothing for the symptoms. The treatment provided no relief.    Past Medical History  Diagnosis Date  . Hypertension   . Thyroid disease   . Kidney stone   . Dementia   . GERD (gastroesophageal reflux disease)   . Anemia    Past Surgical History  Procedure Laterality Date  . Kidney stone surgery     History reviewed. No pertinent family history. History  Substance Use Topics  . Smoking status: Never Smoker   . Smokeless tobacco: Never Used  . Alcohol Use: No   OB History   Grav Para Term Preterm Abortions TAB SAB Ect Mult Living                 Review of Systems  Unable to perform ROS: Acuity of condition  Constitutional: Negative for fever and diaphoresis.  HENT: Negative for congestion.   Respiratory: Negative for cough.   Cardiovascular: Negative for chest pain.  Gastrointestinal: Negative for vomiting and abdominal pain.  Neurological: Positive for weakness.      Allergies  Ciprofloxacin and Seroquel  Home Medications   No current outpatient prescriptions on file. BP 212/130  Pulse 94  Temp(Src) 97.8 F (36.6 C) (Axillary)  Resp 20  Wt 110  lb 4.8 oz (50.032 kg)  SpO2 92% Physical Exam  Constitutional: She is oriented to person, place, and time. She appears well-developed and well-nourished. No distress.  Ptwith L sided neglect  HENT:  Head: Normocephalic and atraumatic.  Mouth/Throat: Oropharynx is clear and moist.  No head trauma  Eyes: Conjunctivae are normal. Pupils are equal, round, and reactive to light. Right eye exhibits no discharge. Left eye exhibits no discharge. No scleral icterus.  Does not acknowledge R side even when prompted  Neck: Normal range of motion. Neck supple.  Cardiovascular: Normal rate, regular rhythm and intact distal pulses.  Exam reveals no gallop and no friction rub.   No murmur heard. Pulmonary/Chest: Effort normal and breath sounds normal. No respiratory distress. She has no wheezes. She has no rales.  Abdominal: Soft. She exhibits no distension and no mass. There is no tenderness.  Musculoskeletal: Normal range of motion.  Neurological: She is alert and oriented to person, place, and time. A cranial nerve deficit (L facial droop) is present. She exhibits normal muscle tone. Coordination normal.  5/5 RUE and RLE. 3/5 LUE and LLE, sensation intact. 2+ DTRS patella and brachioradilias b/l, not ambulated as fall risk  Skin: She is not diaphoretic.    ED Course  Procedures (including critical care time) Labs Review Labs Reviewed  COMPREHENSIVE METABOLIC PANEL - Abnormal; Notable for the following:    Glucose, Bld 119 (*)    BUN 39 (*)    Creatinine, Ser 1.50 (*)  GFR calc non Af Amer 27 (*)    GFR calc Af Amer 32 (*)    All other components within normal limits  URINALYSIS, ROUTINE W REFLEX MICROSCOPIC - Abnormal; Notable for the following:    Hgb urine dipstick TRACE (*)    Protein, ur 100 (*)    All other components within normal limits  CBG MONITORING, ED - Abnormal; Notable for the following:    Glucose-Capillary 112 (*)    All other components within normal limits  I-STAT CHEM  8, ED - Abnormal; Notable for the following:    BUN 48 (*)    Creatinine, Ser 1.70 (*)    Glucose, Bld 120 (*)    Calcium, Ion 1.06 (*)    All other components within normal limits  MRSA PCR SCREENING  PROTIME-INR  APTT  CBC  DIFFERENTIAL  URINE MICROSCOPIC-ADD ON  HEMOGLOBIN A1C  LIPID PANEL  I-STAT TROPOININ, ED   Imaging Review Dg Chest 2 View  01/12/2014   CLINICAL DATA:  Stroke  EXAM: CHEST  2 VIEW  COMPARISON:  11/10/2013  FINDINGS: Frontal view is rotated.  Increased interstitial markings/bronchitic changes. Mild patchy opacity is possible in the left mid lung, equivocal. No pleural effusion or pneumothorax.  The heart is normal in size.  Degenerative changes of the visualized thoracolumbar spine.  IMPRESSION: Frontal view is rotated.  Possible mild patchy opacity in the left mid lung, equivocal. Pneumonia is not excluded. Consider repeat frontal radiograph.  Increased interstitial markings/bronchitic changes.   Electronically Signed   By: Charline BillsSriyesh  Krishnan M.D.   On: 01/12/2014 22:40   Ct Head (brain) Wo Contrast  01/12/2014   CLINICAL DATA:  Code stroke, left-sided weakness  EXAM: CT HEAD WITHOUT CONTRAST  TECHNIQUE: Contiguous axial images were obtained from the base of the skull through the vertex without intravenous contrast.  COMPARISON:  11/10/2013  FINDINGS: No evidence of parenchymal hemorrhage or extra-axial fluid collection. No mass lesion, mass effect, or midline shift.  No CT evidence of acute infarction.  Old right thalamic lacunar infarct (series 2/ image 16).  Subcortical white matter and periventricular small vessel ischemic changes. Intracranial atherosclerosis.  Age related cortical atrophy.  No ventriculomegaly.  The visualized paranasal sinuses are essentially clear. The mastoid air cells are unopacified.  No evidence of calvarial fracture.  IMPRESSION: No evidence of acute intracranial abnormality.  Old right thalamic lacunar infarct.  Small vessel ischemic changes  with intracranial atherosclerosis.  These results were called by telephone at the time of interpretation on 01/12/2014 at 7:20 PM to Dr. Thad Rangereynolds, who verbally acknowledged these results.   Electronically Signed   By: Charline BillsSriyesh  Krishnan M.D.   On: 01/12/2014 19:22     EKG Interpretation   Date/Time:  Friday January 12 2014 19:27:52 EDT Ventricular Rate:  86 PR Interval:  166 QRS Duration: 102 QT Interval:  384 QTC Calculation: 459 R Axis:   79 Text Interpretation:  Sinus rhythm Biatrial enlargement LVH with secondary  repolarization abnormality REPOLARIZATION ABNORMALITY is new sincel last  tracing Confirmed by POLLINA  MD, CHRISTOPHER [phone]([id number]) on 01/12/2014 8:17:22  PM      MDM   MDM:  80 y.o. Female w HTN, dementia w/ cc: of CVA. L sided facial droop and LUE and LLE weakness last normal 1.5 hours ago. Code stroke called. Hypertensive on arrival, normal blood glucose. Head CT w/o bleed. Neurology saw and evaluated, offered tpa but declined by patient and family. Pt with persistent L facial droop and L  hemiparesis and L neglect. BP controlled with Labetalol in ED to goal of 25% reduction as hypertensive on arrival. Medicine called for admission, admit to medicine.  Final diagnoses:  CVA (cerebral infarction)  Hemiplegia, unspecified, affecting nondominant side    Admit to Medicine    Pilar Jarvis, MD 01/12/14 [id number]

## 2014-01-13 ENCOUNTER — Inpatient Hospital Stay (HOSPITAL_COMMUNITY): Payer: Medicare Other

## 2014-01-13 DIAGNOSIS — I10 Essential (primary) hypertension: Secondary | ICD-10-CM | POA: Diagnosis present

## 2014-01-13 DIAGNOSIS — E039 Hypothyroidism, unspecified: Secondary | ICD-10-CM | POA: Diagnosis present

## 2014-01-13 DIAGNOSIS — E785 Hyperlipidemia, unspecified: Secondary | ICD-10-CM

## 2014-01-13 DIAGNOSIS — F0391 Unspecified dementia with behavioral disturbance: Secondary | ICD-10-CM | POA: Diagnosis present

## 2014-01-13 DIAGNOSIS — F03918 Unspecified dementia, unspecified severity, with other behavioral disturbance: Secondary | ICD-10-CM

## 2014-01-13 LAB — MRSA PCR SCREENING: MRSA by PCR: NEGATIVE

## 2014-01-13 LAB — HEMOGLOBIN A1C
HEMOGLOBIN A1C: 5.7 % — AB (ref <5.7)
MEAN PLASMA GLUCOSE: 117 mg/dL — AB (ref <117)

## 2014-01-13 LAB — LIPID PANEL
CHOLESTEROL: 188 mg/dL (ref 0–200)
HDL: 43 mg/dL (ref >39)
LDL Cholesterol: 106 mg/dL — ABNORMAL HIGH (ref 0–99)
TRIGLYCERIDES: 196 mg/dL — AB (ref <150)
Total CHOL/HDL Ratio: 4.4 RATIO
VLDL: 39 mg/dL (ref 0–40)

## 2014-01-13 MED ORDER — LORAZEPAM 2 MG/ML IJ SOLN: 0.5000 mg | Freq: Four times a day (QID) | INTRAMUSCULAR | Status: DC | PRN

## 2014-01-13 MED ORDER — LORAZEPAM 2 MG/ML IJ SOLN
0.5000 mg | Freq: Two times a day (BID) | INTRAMUSCULAR | Status: DC | PRN
  Administered 2014-01-13 – 2014-01-14 (×3): 0.5 mg via INTRAVENOUS
  Filled 2014-01-13 (×4): qty 1

## 2014-01-13 MED ORDER — LEVOTHYROXINE SODIUM 100 MCG IV SOLR
25.0000 ug | Freq: Every day | INTRAVENOUS | Status: DC
  Administered 2014-01-13 – 2014-01-14 (×2): 25 ug via INTRAVENOUS
  Filled 2014-01-13 (×2): qty 5

## 2014-01-13 MED ORDER — LORAZEPAM 2 MG/ML IJ SOLN: 0.5000 mg | Freq: Three times a day (TID) | INTRAMUSCULAR | Status: DC | PRN

## 2014-01-13 MED ORDER — CLONIDINE HCL 0.1 MG/24HR TD PTWK: 0.1000 mg | MEDICATED_PATCH | TRANSDERMAL | Status: DC

## 2014-01-13 MED ORDER — CLONIDINE HCL 0.2 MG/24HR TD PTWK
0.2000 mg | MEDICATED_PATCH | TRANSDERMAL | Status: DC
  Administered 2014-01-13: 0.2 mg via TRANSDERMAL
  Filled 2014-01-13: qty 1

## 2014-01-13 MED ORDER — LORAZEPAM 2 MG/ML IJ SOLN: 0.5000 mg | Freq: Two times a day (BID) | INTRAMUSCULAR | Status: DC | PRN

## 2014-01-13 MED ORDER — HYDRALAZINE HCL 20 MG/ML IJ SOLN
5.0000 mg | INTRAMUSCULAR | Status: DC | PRN
  Administered 2014-01-13: 5 mg via INTRAVENOUS
  Filled 2014-01-13: qty 1

## 2014-01-13 MED ORDER — HYDRALAZINE HCL 20 MG/ML IJ SOLN: 10.0000 mg | Freq: Four times a day (QID) | INTRAMUSCULAR | Status: DC | PRN

## 2014-01-13 MED ORDER — HYDRALAZINE HCL 20 MG/ML IJ SOLN
5.0000 mg | Freq: Four times a day (QID) | INTRAMUSCULAR | Status: DC | PRN
  Administered 2014-01-14: 5 mg via INTRAVENOUS
  Filled 2014-01-13: qty 1

## 2014-01-13 MED ORDER — STROKE: EARLY STAGES OF RECOVERY BOOK
Freq: Once | Status: AC
  Administered 2014-01-14 (×2)
  Filled 2014-01-13: qty 1

## 2014-01-13 MED ORDER — HYDRALAZINE HCL 20 MG/ML IJ SOLN: 10.0000 mg | INTRAMUSCULAR | Status: DC | PRN

## 2014-01-13 MED ORDER — LORAZEPAM 2 MG/ML IJ SOLN: 1.0000 mg | Freq: Once | INTRAMUSCULAR | Status: DC

## 2014-01-13 MED ORDER — LORAZEPAM 2 MG/ML IJ SOLN
0.5000 mg | Freq: Once | INTRAMUSCULAR | Status: AC
  Administered 2014-01-13: 0.5 mg via INTRAVENOUS
  Filled 2014-01-13: qty 1

## 2014-01-13 NOTE — Progress Notes (Signed)
Stroke Team Progress Note  HISTORY Tonya Newman is a [age] y.o. female who was noted to be at baseline at the facility earlier on 01/12/2014. Asked for a sandwich and was able to start eating it normally. When her aid returned patient was noted to have slurred speech and was not moving her left side well. EMS was called and the patient was brought in as a code stroke. Patient was on Plavix daily.  At baseline patient has dementia but was able to communicate normally and ambulates with a walker.   Date last known well: Date: 01/12/2014  Time last known well: Time: 18:15  tPA Given: No: Family does not wish tPA to be given   SUBJECTIVE Patient remains confused. I had a long talk with the patient's daughter and son-in-law. Apparently the patient had a rough night and was combative at times. The family believes that the patient is definitely weaker on the left now compared to her previous stroke in the past.  OBJECTIVE Most recent Vital Signs: Filed Vitals:   01/13/14 0200 01/13/14 0400 01/13/14 0600 01/13/14 0800  BP: 229/100 228/87 220/99 196/163  Pulse:  89 86 95  Temp:  97.6 F (36.4 C)  97 F (36.1 C)  TempSrc:  Axillary  Axillary  Resp:  18  20  Weight:      SpO2:  96%  98%   CBG (last 3)   Recent Labs  01/12/14 1920  GLUCAP 112*    IV Fluid Intake:   . sodium chloride 75 mL/hr at 01/12/14 2323    MEDICATIONS  . aspirin  300 mg Rectal Daily   Or  . aspirin  325 mg Oral Daily  . cloNIDine  0.2 mg Transdermal Weekly  . clopidogrel  75 mg Oral Daily  . donepezil  5 mg Oral QHS  . enoxaparin (LOVENOX) injection  30 mg Subcutaneous Q1200  . levothyroxine  25 mcg Intravenous Daily  . lisinopril  5 mg Oral Daily  . LORazepam  1 mg Intravenous Once   PRN:  albuterol, hydrALAZINE, LORazepam, senna-docusate  Diet:  NPO no liquids Activity:   DVT Prophylaxis:  Lovenox  CLINICALLY SIGNIFICANT STUDIES Basic Metabolic Panel:  Recent Labs Lab 01/12/14 1910  01/12/14 1918  NA 143 141  K 4.2 4.0  CL 102 104  CO2 23  --   GLUCOSE 119* 120*  BUN 39* 48*  CREATININE 1.50* 1.70*  CALCIUM 9.5  --    Liver Function Tests:  Recent Labs Lab 01/12/14 1910  AST 24  ALT 15  ALKPHOS 89  BILITOT 0.3  PROT 7.0  ALBUMIN 3.7   CBC:  Recent Labs Lab 01/12/14 1910 01/12/14 1918  WBC 5.3  --   NEUTROABS 2.6  --   HGB 14.2 15.0  HCT 41.2 44.0  MCV 93.2  --   PLT 193  --    Coagulation:  Recent Labs Lab 01/12/14 1910  LABPROT 11.9  INR 0.89   Cardiac Enzymes: No results found for this basename: CKTOTAL, CKMB, CKMBINDEX, TROPONINI,  in the last 168 hours Urinalysis:  Recent Labs Lab 01/12/14 2005  COLORURINE YELLOW  LABSPEC 1.009  PHURINE 7.0  GLUCOSEU NEGATIVE  HGBUR TRACE*  BILIRUBINUR NEGATIVE  KETONESUR NEGATIVE  PROTEINUR 100*  UROBILINOGEN 0.2  NITRITE NEGATIVE  LEUKOCYTESUR NEGATIVE   Lipid Panel    Component Value Date/Time   CHOL 188 01/13/2014 0450   TRIG 196* 01/13/2014 0450   HDL 43 01/13/2014 0450   CHOLHDL 4.4  01/13/2014 0450   VLDL 39 01/13/2014 0450   LDLCALC 106* 01/13/2014 0450   HgbA1C  No results found for this basename: HGBA1C    Urine Drug Screen:     Component Value Date/Time   LABOPIA NONE DETECTED 11/20/2011 1844   COCAINSCRNUR NONE DETECTED 11/20/2011 1844   LABBENZ NONE DETECTED 11/20/2011 1844   AMPHETMU NONE DETECTED 11/20/2011 1844   THCU NONE DETECTED 11/20/2011 1844   LABBARB NONE DETECTED 11/20/2011 1844    Alcohol Level: No results found for this basename: ETH,  in the last 168 hours  Dg Chest 2 View 01/12/2014    Frontal view is rotated.  Possible mild patchy opacity in the left mid lung, equivocal. Pneumonia is not excluded. Consider repeat frontal radiograph.  Increased interstitial markings/bronchitic changes.     Ct Head (brain) Wo Contrast 01/12/2014    No evidence of acute intracranial abnormality.  Old right thalamic lacunar infarct.  Small vessel ischemic changes with intracranial  atherosclerosis.     MRI of the brain  pending  MRA of the brain  pending  2D Echocardiogram  pending  Carotid Doppler  pending   EKG sinus rhythm rate 86 beats per minute -   For complete results please see formal report.   Therapy Recommendations - inpatient rehabilitation  Physical Exam   Neurologic Examination:  Mental Status:  Alert. Patient unable to give age or date. Does not know where she is. Follows some simple commands but is poorly cooperative. Speech fluent but slurred.  Cranial Nerves:  II: Discs flat bilaterally; Patient does not blink to confrontation from the left. Pupils equal, round, reactive to light and accommodation  III,IV, VI: ptosis not present, Right gaze preference-unable to go beyond midline to the left.  V,VII: left facial droop, facial light touch sensation normal bilaterally  VIII: hearing normal bilaterally  IX,X: gag reflex reduced  XI: shoulder shrug decreased on the left  XII: midline tongue extension  Motor:  Right : Upper extremity 5/5 Left: Upper extremity 2-3/5  Lower extremity 5/5 Lower extremity 4-/5  Tone and bulk:normal tone throughout; no atrophy noted  Sensory: Pinprick and light touch intact throughout, bilaterally  Deep Tendon Reflexes: 2+ and symmetric throughout  Plantars:  Right: upgoing Left: upgoing  Cerebellar:  normal finger-to-nose with the right upper extremity. Difficulty with the left upper extremity secondary to weakness. Gait: Unable to test  CV: pulses palpable throughout    ASSESSMENT Tonya Newman is a 80 y.o. female presenting with dysarthria and left hemiparesis. The patient's family declined TPA. A CT scan was negative for an acute infarct. An MRI/MRA is pending. On clopidogrel 75 mg orally every day prior to admission. Now on clopidogrel 75 mg orally every day for secondary stroke prevention. Patient with resultant left hemiparesis and dysarthria.. Stroke work up underway.   Baseline  dementia  Previous stroke  Hypertension  Cholesterol 188 LDL 106  Renal insufficiency  Abnormal chest x-ray   Hospital day # 1  TREATMENT/PLAN  Continue clopidogrel 75 mg orally every day for secondary stroke prevention.  Inpatient rehabilitation recommended by the therapists  LDL >100, suggest statin when tolerating oral intake  Await MRI/MRA, 2-D echo, hemoglobin A1c and carotid Dopplers  I have personally obtained a history, examined the patient, evaluated imaging results, and formulated the assessment and plan of care. I agree with the above.    To contact Stroke Continuity provider, please refer to [email or web address]. After hours, contact General Neurology

## 2014-01-13 NOTE — Evaluation (Signed)
Physical Therapy Evaluation Patient Details Name: Tonya Newman MRN: [id number] DOB: 07/13/1911 Today's Date: 01/13/2014   History of Present Illness  :This is a 80 y.o. year old female with prior hx/o HTN, dementia presenting with CVA. Pt is resident of [facility]. Per report, pt with noted sudden onset of L sided weakness and slurred speech around 6:30. Pt is otherwise ambulatory with a rolling walker per son. Son had visited with pt earlier in the day and was otherwise at ambulatory baseline. EMS was called  Clinical Impression  Pt admitted with/for L sided weakness.  Pt currently limited functionally due to the problems listed. ( See problems list.)   Pt will benefit from PT to maximize function and safety in order to get ready for next venue listed below.     Follow Up Recommendations CIR    Equipment Recommendations   (TBA)    Recommendations for Other Services Rehab consult     Precautions / Restrictions Precautions Precautions: Fall      Mobility  Bed Mobility Overal bed mobility: Needs Assistance Bed Mobility: Supine to Sit     Supine to sit: Mod assist     General bed mobility comments: pt had sqirmed to EOB and through the rails on her own, but could not sit up without truncal assist due in part b/c uncoordinated L UE  Transfers Overall transfer level: Needs assistance Equipment used: Rolling walker (2 wheeled) Transfers: Sit to/from Stand Sit to Stand: Mod assist         General transfer comment: assist to come forward and mild lifting assist.  Pt leaning posteriorly moderately initially.  Ambulation/Gait Ambulation/Gait assistance: Mod assist Ambulation Distance (Feet): 10 Feet (times 2) Assistive device: Rolling walker (2 wheeled) Gait Pattern/deviations: Step-to pattern (Hemiparetic on the L) Gait velocity: labored Gait velocity interpretation: Below normal speed for age/gender General Gait Details: hemiparetic, with assist  needed to w/shift R and advance L LE (90% of time)  Pt weight beared successfully through the left LE, but could not maintain balance at midline  Stairs            Wheelchair Mobility    Modified Rankin (Stroke Patients Only) Modified Rankin (Stroke Patients Only) Pre-Morbid Rankin Score:  (Educated guess at 3) Modified Rankin: Moderately severe disability     Balance Overall balance assessment: Needs assistance Sitting-balance support: Single extremity supported;Feet supported Sitting balance-Leahy Scale: Poor Sitting balance - Comments: lists Left and posteriorly though can sit for short periods with R UE assist   Standing balance support: Single extremity supported;During functional activity Standing balance-Leahy Scale: Poor Standing balance comment: lean Left and posteriorly,  w/shift difficulty to the right                     Pertinent Vitals/Pain BP  200's /100's RN/MD okay'd seeing pt. O/w VSS    Home Living Family/patient expects to be discharged to:: Assisted living (?snf  lives at [facility])               Home Equipment: Dan Humphreys - 4 wheels Additional Comments: Difficult to get details as the pt is not oriented    Prior Function                 Hand Dominance        Extremity/Trunk Assessment   Upper Extremity Assessment: Defer to OT evaluation (3/28  L UE weak at 3/5 bi/tris 2+ shd, 2/5 grip grossly)  Lower Extremity Assessment: RLE deficits/detail;LLE deficits/detail RLE Deficits / Details: generally weak grossly 3+/5 hip flex, quads df/pf , hams 3-/5 LLE Deficits / Details: grossly 3+/5;  hip flex 2+/5, quads 3+, hams 2/5 , df/pf 2/5 (pt reports sensation the same bilaterally )     Communication   Communication: Expressive difficulties  Cognition Arousal/Alertness: Lethargic Behavior During Therapy: Restless (pt has been agitated for quite a while) Overall Cognitive Status: No family/caregiver present to  determine baseline cognitive functioning                      General Comments      Exercises        Assessment/Plan    PT Assessment Patient needs continued PT services  PT Diagnosis Difficulty walking;Generalized weakness;Hemiplegia non-dominant side   PT Problem List Decreased strength;Decreased activity tolerance;Decreased balance;Decreased mobility;Decreased coordination;Decreased knowledge of use of DME;Decreased safety awareness;Decreased knowledge of precautions  PT Treatment Interventions DME instruction;Gait training;Functional mobility training;Therapeutic activities;Balance training;Neuromuscular re-education;Patient/family education   PT Goals (Current goals can be found in the Care Plan section) Acute Rehab PT Goals Patient Stated Goal: I need to get out of here PT Goal Formulation: Patient unable to participate in goal setting Time For Goal Achievement: 01/27/14 Potential to Achieve Goals: Fair    Frequency Min 3X/week   Barriers to discharge        End of Session   Activity Tolerance: Patient tolerated treatment well Patient left: in chair;with chair alarm set;with call bell/phone within reach         Time: 0948-1020 PT Time Calculation (min): 32 min   Charges:   PT Evaluation $Initial PT Evaluation Tier I: 1 Procedure PT Treatments $Gait Training: 8-22 mins $Therapeutic Activity: 8-22 mins   PT G Codes:          Karrina Lye, Eliseo GumKenneth V 01/13/2014, 10:38 AM  01/13/2014  [place] BingKen Max Romano, PT [phone]  (pager)

## 2014-01-13 NOTE — Progress Notes (Signed)
SLP Cancellation Note  Patient Details Name: Sofie Rowerhelma T Coppolino MRN: [id number] DOB: 11/02/1910   Cancelled treatment:        Attempted x2 to complete BSE and SLE.  Pt somnolent on initial attempt, and off unit for MRI on second.  Will continue efforts.  Akita Maxim B. Murvin NatalBueche, [facility] Limited PartnershipMSP, CCC-SLP [phone]  Leigh AuroraBueche, Miyanna Wiersma Brown 01/13/2014, 1:45 PM

## 2014-01-13 NOTE — Progress Notes (Addendum)
Patient ID: Tonya Newman  female  ZDG:[id number]    DOB: March 27, 1911    DOA: 01/12/2014  PCP: Angelica Chessman., MD  Addendum  Discussed with patient's daughter, [name], after patient was not able to tolerate MRI because of agitation and combativeness. Patient's family requested that all the tests be cancelled stating that even if she has a stroke, she is 80 years old and has had a good life, they donot want to put her through any further testing as it would not change any management or her prognosis. They requested DNR status (they had forgotten to bring the yellow paper), comfort feeds (usually eats soft diet).  [name] requested to see SW for SNF placement. She is not interested in CIR, hence I cancelled the CIR consult. SW consult placed.   Pixie Burgener M.D. [facility] 01/13/2014, 3:43 PM  Pager: [phone]      Assessment/Plan: Principal Problem:   CVA (cerebral infarction) Patient presented with sudden onset of left-sided weakness and dysarthria, BP was noted to be in 200s over 100s - Obtain MRI, MRA, carotid Dopplers, 2-D echo - Will likely need Ativan prior to MRI, patient somewhat agitated  -Currently n.p.o., will need stroke swallow evaluation , Continue aspirin suppository   Active Problems:   Dementia with behavioral disturbance - Will place on IV Ativan as needed     Malignant hypertension: Currently BP still fairly uncontrolled,  - patient is n.p.o., unable to give any oral and hypertensives - Placed on clonidine patch, continue hydralazine as needed     Other and unspecified hyperlipidemia - LDL 106, will need statin once tolerating oral diet     Unspecified hypothyroidism - Change to IV Synthroid   DVT Prophylaxis: Lovenox  Code Status: full code   Family Communication: Discussed with patient's daughter, [name] in detail  Disposition:  Consultants:   neurology   Procedures:   none   Antibiotics:   none      Subjective:  patient oriented to self otherwise agitated, trying to take off her mittens, does not cooperate with exam   Objective: Weight change:   Intake/Output Summary (Last 24 hours) at 01/13/14 1026 Last data filed at 01/13/14 5643  Gross per 24 hour  Intake      0 ml  Output   1400 ml  Net  -1400 ml   Blood pressure 196/163, pulse 95, temperature 97 F (36.1 C), temperature source Axillary, resp. rate 20, weight 50.032 kg (110 lb 4.8 oz), SpO2 98.00%.  Physical Exam: General: Alert and awake agitated, oriented to self CVS: S1-S2 clear, no murmur rubs or gallops Chest: clear to auscultation bilaterally Abdomen: soft nontender, nondistended, normal bowel sounds  Extremities: no cyanosis, clubbing or edema noted bilaterally Neuro:  does not cooperate with exam   Lab Results: Basic Metabolic Panel:  Recent Labs Lab 01/12/14 1910 01/12/14 1918  NA 143 141  K 4.2 4.0  CL 102 104  CO2 23  --   GLUCOSE 119* 120*  BUN 39* 48*  CREATININE 1.50* 1.70*  CALCIUM 9.5  --    Liver Function Tests:  Recent Labs Lab 01/12/14 1910  AST 24  ALT 15  ALKPHOS 89  BILITOT 0.3  PROT 7.0  ALBUMIN 3.7   No results found for this basename: LIPASE, AMYLASE,  in the last 168 hours No results found for this basename: AMMONIA,  in the last 168 hours CBC:  Recent Labs Lab 01/12/14 1910 01/12/14 1918  WBC 5.3  --  NEUTROABS 2.6  --   HGB 14.2 15.0  HCT 41.2 44.0  MCV 93.2  --   PLT 193  --    Cardiac Enzymes: No results found for this basename: CKTOTAL, CKMB, CKMBINDEX, TROPONINI,  in the last 168 hours BNP: No components found with this basename: POCBNP,  CBG:  Recent Labs Lab 01/12/14 1920  GLUCAP 112*     Micro Results: Recent Results (from the past 240 hour(s))  MRSA PCR SCREENING     Status: None   Collection Time    01/12/14 11:20 PM      Result Value Ref Range Status   MRSA by PCR NEGATIVE  NEGATIVE Final   Comment:            The GeneXpert  MRSA Assay (FDA     approved for NASAL specimens     only), is one component of a     comprehensive MRSA colonization     surveillance program. It is not     intended to diagnose MRSA     infection nor to guide or     monitor treatment for     MRSA infections.    Studies/Results: Dg Chest 2 View  01/12/2014   CLINICAL DATA:  Stroke  EXAM: CHEST  2 VIEW  COMPARISON:  11/10/2013  FINDINGS: Frontal view is rotated.  Increased interstitial markings/bronchitic changes. Mild patchy opacity is possible in the left mid lung, equivocal. No pleural effusion or pneumothorax.  The heart is normal in size.  Degenerative changes of the visualized thoracolumbar spine.  IMPRESSION: Frontal view is rotated.  Possible mild patchy opacity in the left mid lung, equivocal. Pneumonia is not excluded. Consider repeat frontal radiograph.  Increased interstitial markings/bronchitic changes.   Electronically Signed   By: Charline BillsSriyesh  Krishnan M.D.   On: 01/12/2014 22:40   Ct Head (brain) Wo Contrast  01/12/2014   CLINICAL DATA:  Code stroke, left-sided weakness  EXAM: CT HEAD WITHOUT CONTRAST  TECHNIQUE: Contiguous axial images were obtained from the base of the skull through the vertex without intravenous contrast.  COMPARISON:  11/10/2013  FINDINGS: No evidence of parenchymal hemorrhage or extra-axial fluid collection. No mass lesion, mass effect, or midline shift.  No CT evidence of acute infarction.  Old right thalamic lacunar infarct (series 2/ image 16).  Subcortical white matter and periventricular small vessel ischemic changes. Intracranial atherosclerosis.  Age related cortical atrophy.  No ventriculomegaly.  The visualized paranasal sinuses are essentially clear. The mastoid air cells are unopacified.  No evidence of calvarial fracture.  IMPRESSION: No evidence of acute intracranial abnormality.  Old right thalamic lacunar infarct.  Small vessel ischemic changes with intracranial atherosclerosis.  These results were  called by telephone at the time of interpretation on 01/12/2014 at 7:20 PM to Dr. Thad Rangereynolds, who verbally acknowledged these results.   Electronically Signed   By: Charline BillsSriyesh  Krishnan M.D.   On: 01/12/2014 19:22    Medications: Scheduled Meds: . aspirin  300 mg Rectal Daily   Or  . aspirin  325 mg Oral Daily  . clopidogrel  75 mg Oral Daily  . donepezil  5 mg Oral QHS  . enoxaparin (LOVENOX) injection  30 mg Subcutaneous Q1200  . levothyroxine  50 mcg Oral QAC breakfast  . lisinopril  5 mg Oral Daily  . LORazepam  1 mg Intravenous Once      LOS: 1 day   Derrall Hicks M.D. [facility] 01/13/2014, 10:26 AM Pager: [phone]  If 7PM-7AM, please contact  night-coverage [email or web address] Password [email or web address]

## 2014-01-13 NOTE — Evaluation (Signed)
Clinical/Bedside Swallow Evaluation Patient Details  Name: Tonya Newman MRN: [id number] Date of Birth: 05/08/1911  Today's Date: 01/13/2014 Time: 1445-1530 SLP Time Calculation (min): 45 min  Past Medical History:  Past Medical History  Diagnosis Date  . Hypertension   . Thyroid disease   . Kidney stone   . Dementia   . GERD (gastroesophageal reflux disease)   . Anemia    Past Surgical History:  Past Surgical History  Procedure Laterality Date  . Kidney stone surgery     HPI:  80 year old female admitted 01/12/14 with weakness and slurred speech.  PMH significant for dementia, GERD   Assessment / Plan / Recommendation Clinical Impression  SLP and daughter attempted oral care.  Pt refused to participate.  Pt initially uncooperative with po trials, however, after daughter's persistance, pt accepted sips of water and small bites of puree. Pt appeared to tolerate puree and cup sips of water, but exhibited immediate cough following thin liquid via straw.  Recommend puree diet with thin liquids via cup.  [facility]  Daughter and RN aware of recommendations.    Aspiration Risk  Moderate    Diet Recommendation Dysphagia 1 (Puree);Thin liquid   Liquid Administration via: Cup;No straw Medication Administration: Crushed with puree Supervision: Full supervision/cueing for compensatory strategies Compensations: Slow rate;Small sips/bites Postural Changes and/or Swallow Maneuvers: Seated upright 90 degrees    Other  Recommendations Oral Care Recommendations: Oral care BID   Follow Up Recommendations  24 hour supervision/assistance    Frequency and Duration min 1 x/week  1 week   Pertinent Vitals/Pain VSS, no pain indicated    SLP Swallow Goals  adherence to posted precautions   Swallow Study Prior Functional Status   No history of dysphagia per daughter. Pt tolerated soft foods/ thin liquids prior to admit.    General Date of Onset: 01/12/14 HPI: [age]  year old female admitted 01/12/14 with weakness and slurred speech.  PMH significant for dementia, GERD Type of Study: Bedside swallow evaluation Previous Swallow Assessment: none found Diet Prior to this Study: NPO Temperature Spikes Noted: No Respiratory Status: Room air History of Recent Intubation: No Behavior/Cognition: Alert;Confused;Uncooperative;Doesn't follow directions;Requires cueing;Decreased sustained attention Oral Cavity - Dentition: Adequate natural dentition Self-Feeding Abilities: Needs assist Patient Positioning: Upright in chair Baseline Vocal Quality: Clear Volitional Cough: Cognitively unable to elicit Volitional Swallow: Unable to elicit    Oral/Motor/Sensory Function Overall Oral Motor/Sensory Function: Appears within functional limits for tasks assessed   Ice Chips Ice chips: Within functional limits Presentation: Spoon   Thin Liquid Thin Liquid: Impaired Presentation: Cup;Straw Pharyngeal  Phase Impairments: Cough - Immediate (following sip via straw) Other Comments: no cough noted with cup sip    Nectar Thick Nectar Thick Liquid: Not tested   Honey Thick Honey Thick Liquid: Not tested   Puree Puree: Within functional limits Presentation: Spoon   Solid   GO    Solid: Not tested      Kamyla Olejnik B. Murvin NatalBueche, [facility] HospitalMSP, CCC-SLP [phone]  Leigh AuroraBueche, Damita Eppard Brown 01/13/2014,3:43 PM

## 2014-01-13 NOTE — Progress Notes (Signed)
When patient arrived on unit, initial BP was 212/130 using the dinamap.  Unable to assess a manual BP due to the patient being agitated and combative.  Dr. Thad Rangereynolds, neurology, paged regarding high BP.  No new orders received.  Will continue to monitor patient.

## 2014-01-14 MED ORDER — LEVOTHYROXINE SODIUM 50 MCG PO TABS
50.0000 ug | ORAL_TABLET | Freq: Every day | ORAL | Status: DC
  Filled 2014-01-14 (×2): qty 1

## 2014-01-14 NOTE — Clinical Social Work Psychosocial (Signed)
Clinical Social Work Department BRIEF PSYCHOSOCIAL ASSESSMENT 01/14/2014  Patient:  Tonya Newman, Tonya Newman     Account Number:  [id number]     Admit date:  01/12/2014  Clinical Social Worker:  Lovey Newcomer  Date/Time:  01/14/2014 03:24 PM  Referred by:  Physician  Date Referred:  01/14/2014 Referred for  SNF Placement   Other Referral:   Interview type:  Family Other interview type:   Patient's daughter and son in law interviewed at bedside.    PSYCHOSOCIAL DATA Living Status:  FACILITY Admitted from facility:  [facility] Level of care:  Assisted Living Primary support name:  Izora Gala Primary support relationship to patient:  CHILD, ADULT Degree of support available:   Support is strong    CURRENT CONCERNS Current Concerns  Post-Acute Placement   Other Concerns:    SOCIAL WORK ASSESSMENT / PLAN CSW met with patient's daughter and son in-law at bedside to complete assessment. Daughter Izora Gala states that patient lives at [facility] but understands that she is currently needing a higher level of care. Izora Gala has three facilities listed for preference (1) [facility], ([facility]) [facility], (3) [facility]. CSW explained SNF search/placement process and answered daughter's questions. CSW inquired about whether or not this will be long term placement for patient and daughter states that that will depend on how well she improves. Daughter states that she has gotten an application for Medicaid for patient and will submit it early this week.   Assessment/plan status:  Psychosocial Support/Ongoing Assessment of Needs Other assessment/ plan:   Complete FL2, Fax, PASRR   Information/referral to community resources:   SNF list given to patient's daughter.    PATIENT'S/FAMILY'S RESPONSE TO PLAN OF CARE: Patient's daughter Izora Gala plans for patient to DC to SNF. Izora Gala was pleasant, appropriate, and appreciative of CSW contact. Izora Gala is very concerned for her mother's care  and is thankfull for CSW's asssitance with placement. CSW will follow up with bed offers when available.       Liz Beach, [place], [place], [phone]

## 2014-01-14 NOTE — Progress Notes (Signed)
Stroke Team Progress Note  HISTORY Tonya Newman is a 80 y.o. female who was noted to be at baseline at the facility earlier on 01/12/2014. Asked for a sandwich and was able to start eating it normally. When her aid returned patient was noted to have slurred speech and was not moving her left side well. EMS was called and the patient was brought in as a code stroke. Patient was on Plavix daily.  At baseline patient has dementia but was able to communicate normally and ambulates with a walker.   Date last known well: Date: 01/12/2014  Time last known well: Time: 18:15  tPA Given: No: Family does not wish tPA to be given   SUBJECTIVE Patient remains confused. I had a long talk with the patient's daughter and son-in-law. Apparently the patient had a rough night and was combative at times. The family believes that the patient is definitely weaker on the left now compared to her previous stroke in the past.  OBJECTIVE Most recent Vital Signs: Filed Vitals:   01/13/14 2000 01/14/14 0000 01/14/14 0500 01/14/14 0741  BP: 195/89 186/74 157/57 151/60  Pulse: 72 78 61 65  Temp: 97.7 F (36.5 C)  97.5 F (36.4 C)   TempSrc: Axillary  Axillary   Resp: 16 16 16 15   Weight:      SpO2: 96% 97% 97% 97%   CBG (last 3)   Recent Labs  01/12/14 1920  GLUCAP 112*    IV Fluid Intake:   . sodium chloride 75 mL/hr at 01/14/14 0443    MEDICATIONS  .  stroke: mapping our early stages of recovery book   Does not apply Once  . aspirin  300 mg Rectal Daily   Or  . aspirin  325 mg Oral Daily  . clopidogrel  75 mg Oral Daily  . donepezil  5 mg Oral QHS  . enoxaparin (LOVENOX) injection  30 mg Subcutaneous Q1200  . levothyroxine  25 mcg Intravenous Daily  . lisinopril  5 mg Oral Daily  . LORazepam  1 mg Intravenous Once   PRN:  albuterol, hydrALAZINE, LORazepam, senna-docusate  Diet:  Dysphagia no liquids Activity:   DVT Prophylaxis:  Lovenox  CLINICALLY SIGNIFICANT STUDIES Basic  Metabolic Panel:   Recent Labs Lab 01/12/14 1910 01/12/14 1918  NA 143 141  K 4.2 4.0  CL 102 104  CO2 23  --   GLUCOSE 119* 120*  BUN 39* 48*  CREATININE 1.50* 1.70*  CALCIUM 9.5  --    Liver Function Tests:   Recent Labs Lab 01/12/14 1910  AST 24  ALT 15  ALKPHOS 89  BILITOT 0.3  PROT 7.0  ALBUMIN 3.7   CBC:   Recent Labs Lab 01/12/14 1910 01/12/14 1918  WBC 5.3  --   NEUTROABS 2.6  --   HGB 14.2 15.0  HCT 41.2 44.0  MCV 93.2  --   PLT 193  --    Coagulation:   Recent Labs Lab 01/12/14 1910  LABPROT 11.9  INR 0.89   Cardiac Enzymes: No results found for this basename: CKTOTAL, CKMB, CKMBINDEX, TROPONINI,  in the last 168 hours Urinalysis:   Recent Labs Lab 01/12/14 2005  COLORURINE YELLOW  LABSPEC 1.009  PHURINE 7.0  GLUCOSEU NEGATIVE  HGBUR TRACE*  BILIRUBINUR NEGATIVE  KETONESUR NEGATIVE  PROTEINUR 100*  UROBILINOGEN 0.2  NITRITE NEGATIVE  LEUKOCYTESUR NEGATIVE   Lipid Panel    Component Value Date/Time   CHOL 188 01/13/2014 0450  TRIG 196* 01/13/2014 0450   HDL 43 01/13/2014 0450   CHOLHDL 4.4 01/13/2014 0450   VLDL 39 01/13/2014 0450   LDLCALC 106* 01/13/2014 0450   HgbA1C  Lab Results  Component Value Date   HGBA1C 5.7* 01/13/2014    Urine Drug Screen:     Component Value Date/Time   LABOPIA NONE DETECTED 11/20/2011 1844   COCAINSCRNUR NONE DETECTED 11/20/2011 1844   LABBENZ NONE DETECTED 11/20/2011 1844   AMPHETMU NONE DETECTED 11/20/2011 1844   THCU NONE DETECTED 11/20/2011 1844   LABBARB NONE DETECTED 11/20/2011 1844    Alcohol Level: No results found for this basename: ETH,  in the last 168 hours  Dg Chest 2 View 01/12/2014    Frontal view is rotated.  Possible mild patchy opacity in the left mid lung, equivocal. Pneumonia is not excluded. Consider repeat frontal radiograph.  Increased interstitial markings/bronchitic changes.     Ct Head (brain) Wo Contrast 01/12/2014    No evidence of acute intracranial abnormality.  Old  right thalamic lacunar infarct.  Small vessel ischemic changes with intracranial atherosclerosis.     MRI of the brain  pending  MRA of the brain  pending  2D Echocardiogram  pending  Carotid Doppler  pending   EKG sinus rhythm rate 86 beats per minute -   For complete results please see formal report.   Therapy Recommendations - inpatient rehabilitation  Physical Exam   Neurologic Examination:  Mental Status:  Alert. Patient unable to give age or date. Does not know where she is. Follows some simple commands but is poorly cooperative. Speech fluent but slurred.  Cranial Nerves:  II: Discs flat bilaterally; Patient does not blink to confrontation from the left. Pupils equal, round, reactive to light and accommodation  III,IV, VI: ptosis not present, Right gaze preference-unable to go beyond midline to the left.  V,VII: left facial droop, facial light touch sensation normal bilaterally  VIII: hearing normal bilaterally  IX,X: gag reflex reduced  XI: shoulder shrug decreased on the left  XII: midline tongue extension  Motor:  Right : Upper extremity 5/5 Left: Upper extremity 2-3/5  Lower extremity 5/5 Lower extremity 4-/5  Tone and bulk:normal tone throughout; no atrophy noted  Sensory: Pinprick and light touch intact throughout, bilaterally  Deep Tendon Reflexes: 2+ and symmetric throughout  Plantars:  Right: upgoing Left: upgoing  Cerebellar:  normal finger-to-nose with the right upper extremity. Difficulty with the left upper extremity secondary to weakness. Gait: Unable to test  CV: pulses palpable throughout    ASSESSMENT Tonya Newman is a 80 y.o. female presenting with dysarthria and left hemiparesis. The patient's family declined TPA. A CT scan was negative for an acute infarct. An MRI/MRA is pending. On clopidogrel 75 mg orally every day prior to admission. Now on clopidogrel 75 mg orally every day for secondary stroke prevention. Patient with resultant left  hemiparesis and dysarthria. Patient unable to tolerate MRI, family wishes to discontinue further stroke workup at this time.    Baseline dementia  Previous stroke  Hypertension  Cholesterol 188 LDL 106  Renal insufficiency  Abnormal chest x-ray   Hospital day # 2  TREATMENT/PLAN  Continue clopidogrel 75 mg orally every day for secondary stroke prevention. Would discontinue aspirin, no indication for dual antiplatelet therapy  Inpatient rehabilitation recommended by the therapists but family wishes for SNF  LDL >100, due to patients age would hold on statin at this time  Risk factor modification  Stroke signing off  at this time, please call with further questions  Follow up with Dr Pearlean Brownie at [facility] in 2 months   Elspeth Cho, D.O Neurology-Stroke   To contact Stroke Continuity provider, please refer to [email or web address]. After hours, contact General Neurology

## 2014-01-14 NOTE — Clinical Social Work Psychosocial (Deleted)
Clinical Social Work Department BRIEF PSYCHOSOCIAL ASSESSMENT 01/14/2014  Patient:  Tonya Newman     Account Number:  [id number]     Admit date:  01/11/2014  Clinical Social Worker:  Lovey Newcomer  Date/Time:  01/14/2014 02:12 PM  Referred by:  Physician  Date Referred:  01/14/2014 Referred for  SNF Placement   Other Referral:   Interview type:  Family Other interview type:   CSW interviewed family at bedside.    PSYCHOSOCIAL DATA Living Status:  FACILITY Admitted from facility:  [facility] Level of care:  Assisted Living Primary support name:  Tonya Newman Primary support relationship to patient:  CHILD, ADULT Degree of support available:   Support is strong.    CURRENT CONCERNS Current Concerns  Post-Acute Placement   Other Concerns:    SOCIAL WORK ASSESSMENT / PLAN CSW met with patient's daughter and son in-law at bedside to complete assessment. Daughter Tonya Newman states that patient lives at [facility] but understands that she is currently needing a higher level of care. Tonya Newman has three facilities listed for preference (1) [facility], (2) [facility], (3) [facility]. CSW explained SNF search/placement process and answered daughter's questions. CSW inquired about whether or not this will be long term placement for patient and daughter states that that will depend on how well she improves. Daughter states that she has gotten an application for Medicaid for patient and will submit it early this week.   Assessment/plan status:  Psychosocial Support/Ongoing Assessment of Needs Other assessment/ plan:   Complete FL2, Fax, PASRR   Information/referral to community resources:   SNF list given to patient's daughter.    PATIENT'S/FAMILY'S RESPONSE TO PLAN OF CARE: Patient's daughter Tonya Newman plans for patient to DC to SNF. Tonya Newman was pleasant, appropriate, and appreciative of CSW contact. Tonya Newman is very concerned for her mother's care and is thankfull for  CSW's asssitance with placement. CSW will follow up with bed offers when available.       Liz Beach, [place], [place], [phone]

## 2014-01-14 NOTE — Progress Notes (Signed)
Patient ID: Tonya Newman  female  ZOX:[id number]    DOB: [birth year]    DOA: 01/12/2014  PCP: Angelica Chessman., MD   Assessment/Plan: Principal Problem:   CVA (cerebral infarction) Patient presented with sudden onset of left-sided weakness and dysarthria, BP was noted to be in 200s over 100s Discussed with patient's daughter, [name], after patient was not able to tolerate MRI yesterday for stroke work-up because of agitation and combativeness. Patient's family requested that all the tests be cancelled stating that even if she has a stroke, she is 80 years old and has had a good life, they donot want to put her through any further testing as it would not change any management or her prognosis. They requested DNR status (they had forgotten to bring the yellow paper), comfort feeds (usually eats soft diet).   Active Problems:   Dementia with behavioral disturbance - Will place on IV Ativan as needed     Malignant hypertension: Currently BP still fairly uncontrolled,  - patient is n.p.o., unable to give any oral and hypertensives - Placed on clonidine patch, continue hydralazine as needed     Other and unspecified hyperlipidemia - LDL 106, will need statin once tolerating oral diet     Unspecified hypothyroidism - Change to IV Synthroid   DVT Prophylaxis: Lovenox  Code Status: full code   Family Communication: Discussed with patient's daughter, [name] in detail  Disposition:  Consultants:   neurology   Procedures:   none   Antibiotics:   none     Subjective: Patient sleepy at this time, did not respond to verbal commands, had received Ativan at 4:25 AM this morning  Objective: Weight change:   Intake/Output Summary (Last 24 hours) at 01/14/14 1106 Last data filed at 01/13/14 1945  Gross per 24 hour  Intake      0 ml  Output   1100 ml  Net  -1100 ml   Blood pressure 151/60, pulse 65, temperature 97.5 F (36.4 C), temperature source Axillary,  resp. rate 15, weight 50.032 kg (110 lb 4.8 oz), SpO2 97.00%.  Physical Exam: General: Sleepy and somnolent CVS: S1-S2 clear, no murmur rubs or gallops Chest: CTAB Abdomen: soft ND, NBS Extremities: no cyanosis, clubbing or edema noted bilaterally Neuro:  does not cooperate with exam   Lab Results: Basic Metabolic Panel:  Recent Labs Lab 01/12/14 1910 01/12/14 1918  NA 143 141  K 4.2 4.0  CL 102 104  CO2 23  --   GLUCOSE 119* 120*  BUN 39* 48*  CREATININE 1.50* 1.70*  CALCIUM 9.5  --    Liver Function Tests:  Recent Labs Lab 01/12/14 1910  AST 24  ALT 15  ALKPHOS 89  BILITOT 0.3  PROT 7.0  ALBUMIN 3.7   No results found for this basename: LIPASE, AMYLASE,  in the last 168 hours No results found for this basename: AMMONIA,  in the last 168 hours CBC:  Recent Labs Lab 01/12/14 1910 01/12/14 1918  WBC 5.3  --   NEUTROABS 2.6  --   HGB 14.2 15.0  HCT 41.2 44.0  MCV 93.2  --   PLT 193  --    Cardiac Enzymes: No results found for this basename: CKTOTAL, CKMB, CKMBINDEX, TROPONINI,  in the last 168 hours BNP: No components found with this basename: POCBNP,  CBG:  Recent Labs Lab 01/12/14 1920  GLUCAP 112*     Micro Results: Recent Results (from the past 240 hour(s))  MRSA  PCR SCREENING     Status: None   Collection Time    01/12/14 11:20 PM      Result Value Ref Range Status   MRSA by PCR NEGATIVE  NEGATIVE Final   Comment:            The GeneXpert MRSA Assay (FDA     approved for NASAL specimens     only), is one component of a     comprehensive MRSA colonization     surveillance program. It is not     intended to diagnose MRSA     infection nor to guide or     monitor treatment for     MRSA infections.    Studies/Results: Dg Chest 2 View  01/12/2014   CLINICAL DATA:  Stroke  EXAM: CHEST  2 VIEW  COMPARISON:  11/10/2013  FINDINGS: Frontal view is rotated.  Increased interstitial markings/bronchitic changes. Mild patchy opacity is  possible in the left mid lung, equivocal. No pleural effusion or pneumothorax.  The heart is normal in size.  Degenerative changes of the visualized thoracolumbar spine.  IMPRESSION: Frontal view is rotated.  Possible mild patchy opacity in the left mid lung, equivocal. Pneumonia is not excluded. Consider repeat frontal radiograph.  Increased interstitial markings/bronchitic changes.   Electronically Signed   By: Charline BillsSriyesh  Krishnan M.D.   On: 01/12/2014 22:40   Ct Head (brain) Wo Contrast  01/12/2014   CLINICAL DATA:  Code stroke, left-sided weakness  EXAM: CT HEAD WITHOUT CONTRAST  TECHNIQUE: Contiguous axial images were obtained from the base of the skull through the vertex without intravenous contrast.  COMPARISON:  11/10/2013  FINDINGS: No evidence of parenchymal hemorrhage or extra-axial fluid collection. No mass lesion, mass effect, or midline shift.  No CT evidence of acute infarction.  Old right thalamic lacunar infarct (series 2/ image 16).  Subcortical white matter and periventricular small vessel ischemic changes. Intracranial atherosclerosis.  Age related cortical atrophy.  No ventriculomegaly.  The visualized paranasal sinuses are essentially clear. The mastoid air cells are unopacified.  No evidence of calvarial fracture.  IMPRESSION: No evidence of acute intracranial abnormality.  Old right thalamic lacunar infarct.  Small vessel ischemic changes with intracranial atherosclerosis.  These results were called by telephone at the time of interpretation on 01/12/2014 at 7:20 PM to Dr. Thad Rangereynolds, who verbally acknowledged these results.   Electronically Signed   By: Charline BillsSriyesh  Krishnan M.D.   On: 01/12/2014 19:22    Medications: Scheduled Meds: .  stroke: mapping our early stages of recovery book   Does not apply Once  . aspirin  300 mg Rectal Daily   Or  . aspirin  325 mg Oral Daily  . clopidogrel  75 mg Oral Daily  . donepezil  5 mg Oral QHS  . enoxaparin (LOVENOX) injection  30 mg Subcutaneous  Q1200  . levothyroxine  25 mcg Intravenous Daily  . lisinopril  5 mg Oral Daily  . LORazepam  1 mg Intravenous Once      LOS: 2 days   RAI,RIPUDEEP M.D. [facility] 01/14/2014, 11:06 AM Pager: [phone]  If 7PM-7AM, please contact night-coverage [email or web address] Password [email or web address]

## 2014-01-14 NOTE — Clinical Social Work Placement (Addendum)
Clinical Social Work Department CLINICAL SOCIAL WORK PLACEMENT NOTE 01/14/2014  Patient:  Tonya Newman,Tonya Newman  Account Number:  [id number] Admit date:  01/12/2014  Clinical Social Worker:  Lavell LusterJOSEPH BRYANT CAMPBELL, LCSWA  Date/time:  01/14/2014 07:38 PM  Clinical Social Work is seeking post-discharge placement for this patient at the following level of care:   SKILLED NURSING   (*CSW will update this form in Epic as items are completed)   01/14/2014  [facility]/family provided with [facility] of Clinical Social Work's list of facilities offering this level of care within the geographic area requested by the patient (or if unable, by the patient's family).  01/14/2014  Patient/family informed of their freedom to choose among providers that offer the needed level of care, that participate in Medicare, Medicaid or managed care program needed by the patient, have an available bed and are willing to accept the patient.  01/14/2014  Patient/family informed of [facility]' ownership interest in [facility], as well as of the fact that they are under no obligation to receive care at this facility.  PASARR submitted to EDS on  PASARR number received from EDS on   FL2 transmitted to all facilities in geographic area requested by pt/family on  01/14/2014 FL2 transmitted to all facilities within larger geographic area on   Patient informed that his/her managed care company has contracts with or will negotiate with  certain facilities, including the following:     Patient/family informed of bed offers received:  01/15/2014 [facility] Physician recommends and patient chooses bed at    [facility]/2014  [facility]   The following physician request were entered in Epic:   Additional Comments:    Roddie McBryant Campbell, [place], Bridget HartshornLCASA, [phone]

## 2014-01-15 MED ORDER — SENNOSIDES-DOCUSATE SODIUM 8.6-50 MG PO TABS: 1.0000 | ORAL_TABLET | Freq: Every evening | ORAL | Status: AC | PRN

## 2014-01-15 MED ORDER — AMLODIPINE BESYLATE 5 MG PO TABS: 5.0000 mg | ORAL_TABLET | Freq: Every day | ORAL | Status: AC

## 2014-01-15 MED ORDER — AMLODIPINE BESYLATE 5 MG PO TABS
5.0000 mg | ORAL_TABLET | Freq: Every day | ORAL | Status: DC
  Filled 2014-01-15: qty 1

## 2014-01-15 MED ORDER — LORAZEPAM 2 MG/ML IJ SOLN
0.5000 mg | Freq: Once | INTRAMUSCULAR | Status: AC
  Administered 2014-01-15: 0.5 mg via INTRAVENOUS

## 2014-01-15 MED ORDER — SIMVASTATIN 5 MG PO TABS: 5.0000 mg | ORAL_TABLET | Freq: Every day | ORAL | Status: AC

## 2014-01-15 MED ORDER — SIMVASTATIN 5 MG PO TABS
5.0000 mg | ORAL_TABLET | Freq: Every day | ORAL | Status: DC
  Filled 2014-01-15: qty 1

## 2014-01-15 MED ORDER — LORAZEPAM 0.5 MG PO TABS: 0.5000 mg | ORAL_TABLET | Freq: Three times a day (TID) | ORAL | Status: AC | PRN

## 2014-01-15 NOTE — Discharge Instructions (Signed)
STROKE/TIA DISCHARGE INSTRUCTIONS SMOKING Cigarette smoking nearly doubles your risk of having a stroke & is the single most alterable risk factor  If you smoke or have smoked in the last 12 months, you are advised to quit smoking for your health.  Most of the excess cardiovascular risk related to smoking disappears within a year of stopping.  Ask you doctor about anti-smoking medications  [place] Quit Line: 1-800-QUIT NOW  Free Smoking Cessation Classes [phone]  CHOLESTEROL Know your levels; limit fat & cholesterol in your diet  Lipid Panel     Component Value Date/Time   CHOL 188 01/13/2014 0450   TRIG 196* 01/13/2014 0450   HDL 43 01/13/2014 0450   CHOLHDL 4.4 01/13/2014 0450   VLDL 39 01/13/2014 0450   LDLCALC 106* 01/13/2014 0450      Many patients benefit from treatment even if their cholesterol is at goal.  Goal: Total Cholesterol (CHOL) less than 160  Goal:  Triglycerides (TRIG) less than 150  Goal:  HDL greater than 40  Goal:  LDL (LDLCALC) less than 100   BLOOD PRESSURE American Stroke Association blood pressure target is less that 120/80 mm/Hg  Your discharge blood pressure is:  BP:  (refused)  Monitor your blood pressure  Limit your salt and alcohol intake  Many individuals will require more than one medication for high blood pressure  DIABETES (A1c is a blood sugar average for last 3 months) Goal HGBA1c is under 7% (HBGA1c is blood sugar average for last 3 months)  Diabetes: No known diagnosis of diabetes    Lab Results  Component Value Date   HGBA1C 5.7* 01/13/2014     Your HGBA1c can be lowered with medications, healthy diet, and exercise.  Check your blood sugar as directed by your physician  Call your physician if you experience unexplained or low blood sugars.  PHYSICAL ACTIVITY/REHABILITATION Goal is 30 minutes at least 4 days per week  Activity: Increase activity slowly,, No driving, and Walk with assistance, Therapies: Physical Therapy: Nursing  Facility Return to work: N/A  Activity decreases your risk of heart attack and stroke and makes your heart stronger.  It helps control your weight and blood pressure; helps you relax and can improve your mood.  Participate in a regular exercise program.  Talk with your doctor about the best form of exercise for you (dancing, walking, swimming, cycling).  DIET/WEIGHT Goal is to maintain a healthy weight  Your discharge diet is: Dysphagia thin liquids Your height is:    Your current weight is: Weight: 50.032 kg (110 lb 4.8 oz) Your Body Mass Index (BMI) is:     Following the type of diet specifically designed for you will help prevent another stroke.  Your goal weight range is:    Your goal Body Mass Index (BMI) is 19-24.  Healthy food habits can help reduce 3 risk factors for stroke:  High cholesterol, hypertension, and excess weight.  RESOURCES Stroke/Support Group:  Call [phone]   STROKE EDUCATION PROVIDED/REVIEWED AND GIVEN TO PATIENT Stroke warning signs and symptoms How to activate emergency medical system (call 911). Medications prescribed at discharge. Need for follow-up after discharge. Personal risk factors for stroke. Pneumonia vaccine given: No Flu vaccine given: No My questions have been answered, the writing is legible, and I understand these instructions.  I will adhere to these goals & educational materials that have been provided to me after my discharge from the hospital.

## 2014-01-15 NOTE — Discharge Summary (Signed)
Physician Discharge Summary  Patient ID: Tonya Newman MRN: [id number] DOB/AGE: 78/25/1912 78 y.o.  Admit date: 01/12/2014 Discharge date: 01/15/2014  Primary Care Physician:  Angelica Chessman., MD  Discharge Diagnoses:    . Acute CVA (cerebral infarction) . Dementia with behavioral disturbance . Malignant hypertension . Other and unspecified hyperlipidemia . Unspecified hypothyroidism  Consults: Neurology/stroke service    Recommendations for Outpatient Follow-up:   Discharge Diet: Dysphagia 1 with thin liquids  Allergies:   Allergies  Allergen Reactions  . Ciprofloxacin Other (See Comments)    unknown  . Seroquel [Quetiapine] Other (See Comments)    unknown     Discharge Medications:   Medication List         albuterol 108 (90 BASE) MCG/ACT inhaler  Commonly known as:  PROVENTIL HFA;VENTOLIN HFA  Inhale 2 puffs into the lungs every 6 (six) hours as needed for wheezing or shortness of breath.     amLODipine 5 MG tablet  Commonly known as:  NORVASC  Take 1 tablet (5 mg total) by mouth daily.     clopidogrel 75 MG tablet  Commonly known as:  PLAVIX  Take 75 mg by mouth daily.     donepezil 5 MG tablet  Commonly known as:  ARICEPT  Take 5 mg by mouth at bedtime.     levothyroxine 50 MCG tablet  Commonly known as:  SYNTHROID, LEVOTHROID  Take 50 mcg by mouth daily.     lisinopril 5 MG tablet  Commonly known as:  PRINIVIL,ZESTRIL  Take 5 mg by mouth daily.     LORazepam 0.5 MG tablet  Commonly known as:  ATIVAN  Take 1 tablet (0.5 mg total) by mouth every 8 (eight) hours as needed for anxiety.     senna-docusate 8.6-50 MG per tablet  Commonly known as:  Senokot-S  Take 1 tablet by mouth at bedtime as needed for mild constipation.     simvastatin 5 MG tablet  Commonly known as:  ZOCOR  Take 1 tablet (5 mg total) by mouth at bedtime.         Brief H and P: For complete details please refer to admission H and P, but in brief This is a 80  y.o. year old female with prior hx/o HTN, dementia presenting with CVA. Pt is resident of [facility]. Per report, pt with noted sudden onset of L sided weakness and slurred speech around 6:30. Pt is otherwise ambulatory with a rolling walker per son. Son had visited with pt earlier in the day and was otherwise at ambulatory baseline. EMS was called  Code stroke called at time of arrival in ER. Initial NIH stroke scale score of 16. Initial head CT negative for any acute intracranial abnormality, though with old r thalamic lacunar infarct. BPs noted 200s/100s. tPa was initially discussed by neuro, however was refused by family. Recs were for in patient admission for stroke eval. Pt noted to be on plavix prior to admission. However, son denies any prior hx/o CAD or CVA in the past. Pt started on full dose ASA.    Hospital Course:  CVA (cerebral infarction)  Patient presented with sudden onset of left-sided weakness and dysarthria, BP was noted to be in 200s over 100s  Discussed with patient's daughter, Ms Tonya Newman, after patient was not able to tolerate MRI yesterday for stroke work-up because of agitation and combativeness. Patient's family requested that all the tests be cancelled stating that even if she has a stroke, she  is 80 years old and has had a good life, they donot want to put her through any further testing as it would not change any management or her prognosis.  They requested DNR status, comfort feeds (usually eats soft diet). Speech evaluation was done and the patient was started on dysphagia 1 diet with thin liquids. She will continue Plavix and statins.   Dementia with behavioral disturbance : Continue Ativan as needed for anxiety   Malignant hypertension: BP was somewhat uncontrolled in 200s at the time of admission. Patient was placed on lisinopril and Norvasc was added.   Other and unspecified hyperlipidemia  - LDL 106,  placed on Zocor 5 mg daily at bedtime    Unspecified hypothyroidism  Continue Synthroid      Day of Discharge BP 188/81  Pulse 88  Temp(Src) 98.4 F (36.9 C) (Oral)  Resp 16  Wt 50.032 kg (110 lb 4.8 oz)  SpO2 94%  Physical Exam: General: Alert and awake  confused, close to her baseline mental status  CVS: S1-S2 clear no murmur rubs or gallops Chest: clear to auscultation bilaterally, no wheezing rales or rhonchi Abdomen: soft nontender, nondistended, normal bowel sounds Extremities: no cyanosis, clubbing or edema noted bilaterally Neuro:  sitting up in the chair, moving all 4 extremities spontaneously   The results of significant diagnostics from this hospitalization (including imaging, microbiology, ancillary and laboratory) are listed below for reference.    LAB RESULTS: Basic Metabolic Panel:  Recent Labs Lab 01/12/14 1910 01/12/14 1918  NA 143 141  K 4.2 4.0  CL 102 104  CO2 23  --   GLUCOSE 119* 120*  BUN 39* 48*  CREATININE 1.50* 1.70*  CALCIUM 9.5  --    Liver Function Tests:  Recent Labs Lab 01/12/14 1910  AST 24  ALT 15  ALKPHOS 89  BILITOT 0.3  PROT 7.0  ALBUMIN 3.7   No results found for this basename: LIPASE, AMYLASE,  in the last 168 hours No results found for this basename: AMMONIA,  in the last 168 hours CBC:  Recent Labs Lab 01/12/14 1910 01/12/14 1918  WBC 5.3  --   NEUTROABS 2.6  --   HGB 14.2 15.0  HCT 41.2 44.0  MCV 93.2  --   PLT 193  --    Cardiac Enzymes: No results found for this basename: CKTOTAL, CKMB, CKMBINDEX, TROPONINI,  in the last 168 hours BNP: No components found with this basename: POCBNP,  CBG:  Recent Labs Lab 01/12/14 1920  GLUCAP 112*    Significant Diagnostic Studies:  Dg Chest 2 View  01/12/2014   CLINICAL DATA:  Stroke  EXAM: CHEST  2 VIEW  COMPARISON:  11/10/2013  FINDINGS: Frontal view is rotated.  Increased interstitial markings/bronchitic changes. Mild patchy opacity is possible in the left mid lung, equivocal. No pleural  effusion or pneumothorax.  The heart is normal in size.  Degenerative changes of the visualized thoracolumbar spine.  IMPRESSION: Frontal view is rotated.  Possible mild patchy opacity in the left mid lung, equivocal. Pneumonia is not excluded. Consider repeat frontal radiograph.  Increased interstitial markings/bronchitic changes.   Electronically Signed   By: Charline BillsSriyesh  Krishnan M.D.   On: 01/12/2014 22:40   Ct Head (brain) Wo Contrast  01/12/2014   CLINICAL DATA:  Code stroke, left-sided weakness  EXAM: CT HEAD WITHOUT CONTRAST  TECHNIQUE: Contiguous axial images were obtained from the base of the skull through the vertex without intravenous contrast.  COMPARISON:  11/10/2013  FINDINGS:  No evidence of parenchymal hemorrhage or extra-axial fluid collection. No mass lesion, mass effect, or midline shift.  No CT evidence of acute infarction.  Old right thalamic lacunar infarct (series 2/ image 16).  Subcortical white matter and periventricular small vessel ischemic changes. Intracranial atherosclerosis.  Age related cortical atrophy.  No ventriculomegaly.  The visualized paranasal sinuses are essentially clear. The mastoid air cells are unopacified.  No evidence of calvarial fracture.  IMPRESSION: No evidence of acute intracranial abnormality.  Old right thalamic lacunar infarct.  Small vessel ischemic changes with intracranial atherosclerosis.  These results were called by telephone at the time of interpretation on 01/12/2014 at 7:20 PM to Dr. Thad Ranger, who verbally acknowledged these results.   Electronically Signed   By: Charline Bills M.D.   On: 01/12/2014 19:22       Disposition and Follow-up: Discharge Orders   Future Appointments Provider Department Dept Phone   01/17/2014 12:30 PM [facility] [phone]   Future Orders Complete By Expires   Diet - low sodium heart healthy  As directed    Increase activity slowly  As directed         DISPOSITION: Skilled nursing facility  DIET:Dysphagia 1 diet with thin liquids    DISCHARGE FOLLOW-UP Follow-up Information   Follow up with Gates Rigg, MD. Schedule an appointment as soon as possible for a visit in 2 months. (Stroke Clinic)    Specialties:  Neurology, Radiology   Contact information:   [address] [place] [address] [phone]       Follow up with Angelica Chessman., MD. Schedule an appointment as soon as possible for a visit in 2 weeks. (for hospital follow-up)    Specialty:  Family Medicine   Contact information:   [address] [place] [address] [phone]       Time spent on Discharge: 40 minutes  Signed:   RAI,RIPUDEEP M.D. [facility] 01/15/2014, 12:18 PM Pager: [phone]

## 2014-01-15 NOTE — Progress Notes (Signed)
CSW (Clinical Child psychotherapistocial Worker) has spoken with Tonya Newman and Tonya Newman and confirmed pt has bed available today. Pt family to transport pt. CSW notified MD and pt nurse.  [name], LCSWA [phone]

## 2014-01-15 NOTE — Progress Notes (Signed)
Pt was being very combative cursing, hitting, & kicking staff. Pt was given 0.5 mg of ativan IV around 0056. Sanda LingerMilam, Diar Berkel R, RN

## 2014-01-15 NOTE — Progress Notes (Signed)
PT Cancellation Note  Patient Details Name: Tonya Newman MRN: [id number] DOB: 03/06/1911   Cancelled Treatment:    Reason Eval/Treat Not Completed: Other (comment) (pt's family stated pt was just OOB and will be DC'ing soon, they declined PT).    Ralene BatheUhlenberg, Daleigh Pollinger Kistler 01/15/2014, 12:48 PM [phone]

## 2014-01-15 NOTE — Progress Notes (Signed)
OT Cancellation Note  Patient Details Name: Tonya Newman MRN: [id number] DOB: 07/06/1911   Cancelled Treatment:    Reason Eval/Treat Not Completed: OT screened, no needs identified, will sign off - pt for discharge to SNF.  Will defer OT eval to SNF.   Angelene GiovanniConarpe, Kamaria Lucia M Betzaira Mentel Smith Centeronarpe, OTR/L [phone] 01/15/2014, 1:00 PM

## 2014-01-15 NOTE — Progress Notes (Signed)
Rehab Admissions Coordinator Note:  Patient was screened by Trish MageLogue, Sabrie Moritz M for appropriateness for an Inpatient Acute Rehab Consult.  At this time, we are recommending Skilled Nursing Facility.  Lelon FrohlichLogue, Jensyn Shave M 01/15/2014, 8:40 AM  I can be reached at [phone].

## 2014-01-16 ENCOUNTER — Other Ambulatory Visit (HOSPITAL_COMMUNITY): Payer: Self-pay | Admitting: *Deleted

## 2014-01-17 ENCOUNTER — Encounter (HOSPITAL_COMMUNITY)
Admission: RE | Admit: 2014-01-17 | Discharge: 2014-01-17 | Disposition: A | Discharge status: Home / Self Care | Payer: Medicare Other | Source: Ambulatory Visit | Attending: Nephrology | Admitting: Nephrology

## 2014-01-17 DIAGNOSIS — I129 Hypertensive chronic kidney disease with stage 1 through stage 4 chronic kidney disease, or unspecified chronic kidney disease: Secondary | ICD-10-CM | POA: Insufficient documentation

## 2014-01-17 DIAGNOSIS — D631 Anemia in chronic kidney disease: Secondary | ICD-10-CM | POA: Insufficient documentation

## 2014-01-17 DIAGNOSIS — N184 Chronic kidney disease, stage 4 (severe): Secondary | ICD-10-CM | POA: Insufficient documentation

## 2014-01-17 DIAGNOSIS — N039 Chronic nephritic syndrome with unspecified morphologic changes: Principal | ICD-10-CM

## 2014-01-17 LAB — POCT HEMOGLOBIN-HEMACUE: HEMOGLOBIN: 12.4 g/dL (ref 12.0–15.0)

## 2014-01-17 MED ORDER — EPOETIN ALFA 20000 UNIT/ML IJ SOLN: 20000.0000 [IU] | INTRAMUSCULAR | Status: DC

## 2014-01-17 NOTE — ED Provider Notes (Signed)
I saw and evaluated the patient, reviewed the resident's note and I agree with the findings and plan.   EKG Interpretation   Date/Time:  Friday January 12 2014 19:27:52 EDT Ventricular Rate:  86 PR Interval:  166 QRS Duration: 102 QT Interval:  384 QTC Calculation: 459 R Axis:   79 Text Interpretation:  Sinus rhythm Biatrial enlargement LVH with secondary  repolarization abnormality REPOLARIZATION ABNORMALITY is new sincel last  tracing Confirmed by Mikeisha Lemonds  MD, Raya Mckinstry [phone] on 01/12/2014 8:17:22  PM     Patient with acute onset of symptoms consistent with CVA. She arrives as a code stroke. Evaluate it in conjunction with neurology. Family has declined TPA. Will admit to medicine.    Gilda Creasehristopher J. Pheobe Sandiford, MD 01/17/14 [phone]

## 2014-01-31 ENCOUNTER — Encounter (HOSPITAL_COMMUNITY)
Admission: RE | Admit: 2014-01-31 | Discharge: 2014-01-31 | Disposition: A | Discharge status: Home / Self Care | Payer: Medicare Other | Source: Ambulatory Visit | Attending: Nephrology | Admitting: Nephrology

## 2014-01-31 LAB — RENAL FUNCTION PANEL
ALBUMIN: 3.1 g/dL — AB (ref 3.5–5.2)
BUN: 39 mg/dL — AB (ref 6–23)
CO2: 24 mEq/L (ref 19–32)
Calcium: 9.1 mg/dL (ref 8.4–10.5)
Chloride: 107 mEq/L (ref 96–112)
Creatinine, Ser: 1.89 mg/dL — ABNORMAL HIGH (ref 0.50–1.10)
GFR calc Af Amer: 24 mL/min — ABNORMAL LOW (ref >90)
GFR, EST NON AFRICAN AMERICAN: 21 mL/min — AB (ref >90)
Glucose, Bld: 97 mg/dL (ref 70–99)
PHOSPHORUS: 3.7 mg/dL (ref 2.3–4.6)
POTASSIUM: 4.5 meq/L (ref 3.7–5.3)
SODIUM: 144 meq/L (ref 137–147)

## 2014-01-31 LAB — POCT HEMOGLOBIN-HEMACUE: Hemoglobin: 11 g/dL — ABNORMAL LOW (ref 12.0–15.0)

## 2014-01-31 LAB — IRON AND TIBC
IRON: 49 ug/dL (ref 42–135)
Saturation Ratios: 23 % (ref 20–55)
TIBC: 215 ug/dL — ABNORMAL LOW (ref 250–470)
UIBC: 166 ug/dL (ref 125–400)

## 2014-01-31 LAB — FERRITIN: Ferritin: 558 ng/mL — ABNORMAL HIGH (ref 10–291)

## 2014-01-31 MED ORDER — EPOETIN ALFA 20000 UNIT/ML IJ SOLN
INTRAMUSCULAR | Status: AC
  Administered 2014-01-31: 20000 [IU] via SUBCUTANEOUS
  Filled 2014-01-31: qty 1

## 2014-01-31 MED ORDER — EPOETIN ALFA 20000 UNIT/ML IJ SOLN: 20000.0000 [IU] | INTRAMUSCULAR | Status: DC

## 2014-02-14 ENCOUNTER — Encounter (HOSPITAL_COMMUNITY): Payer: Medicare Other

## 2014-03-19 DEATH — deceased

## 2015-03-06 IMAGING — CT CT HEAD W/O CM
4 series · 18 of 30 positions shown, 19 images · non-contrast
Comparison: 11/10/2013

CLINICAL DATA: Code stroke, left-sided weakness

EXAM:
CT HEAD WITHOUT CONTRAST
TECHNIQUE: Contiguous axial images were obtained from the base of the skull
through the vertex without intravenous contrast.

[Series 2: head w/o · axial · non-contrast · 0.49mm/px · z∈[+106,+186]mm · 3 of 32 slices shown, 4 images (1 of 2)]
[im 8/32  brain]
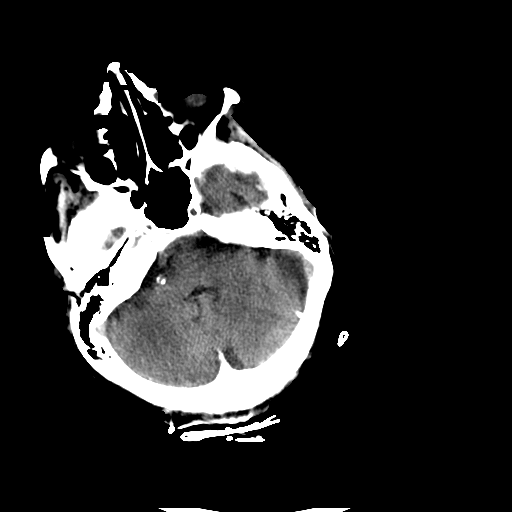
[im 8/32  bone]
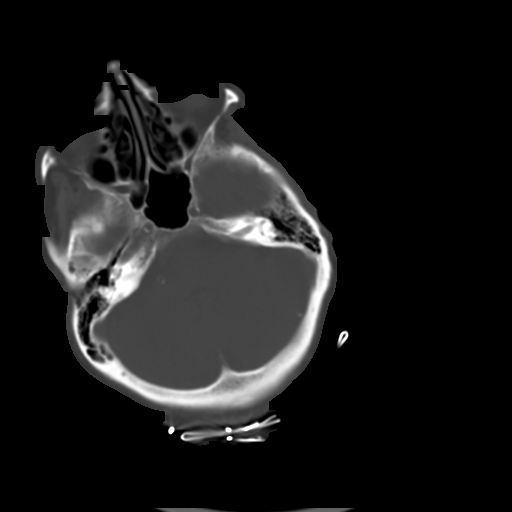
[im 16/32  brain]
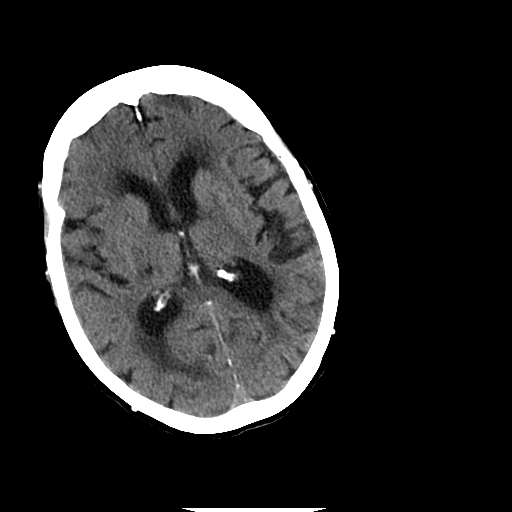
[im 24/32  brain]
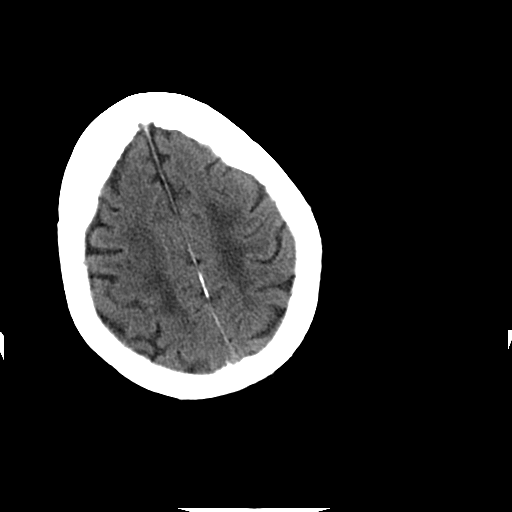

[Series 3: head w/o bone · axial · non-contrast · 0.49mm/px · z∈[+86,+209]mm · 8 of 63 slices shown (1 of 2)]
[im 7/63  bone]
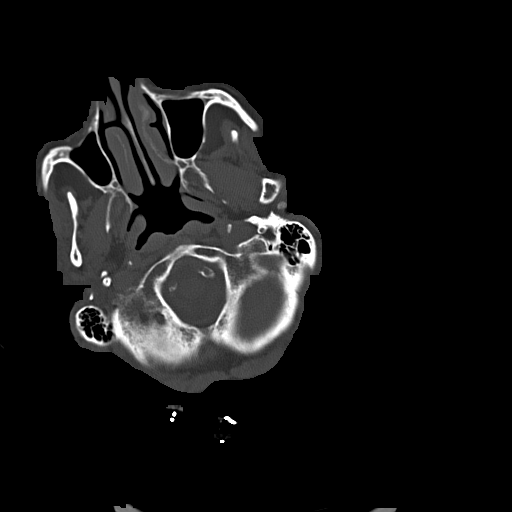
[im 14/63  bone]
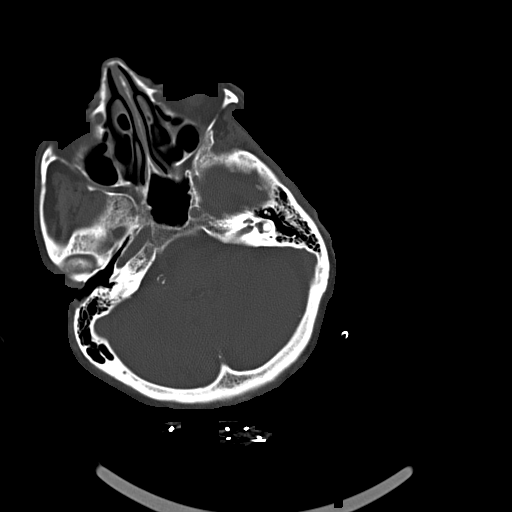
[im 21/63  bone]
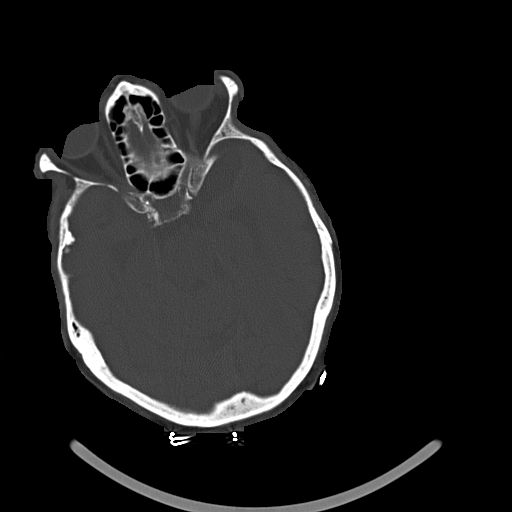
[im 28/63  bone]
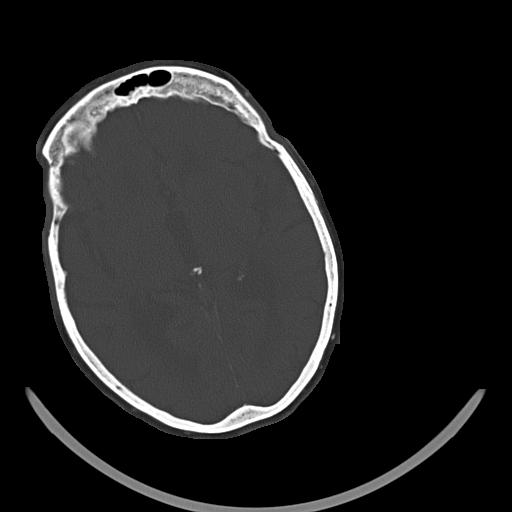
[im 35/63  bone]
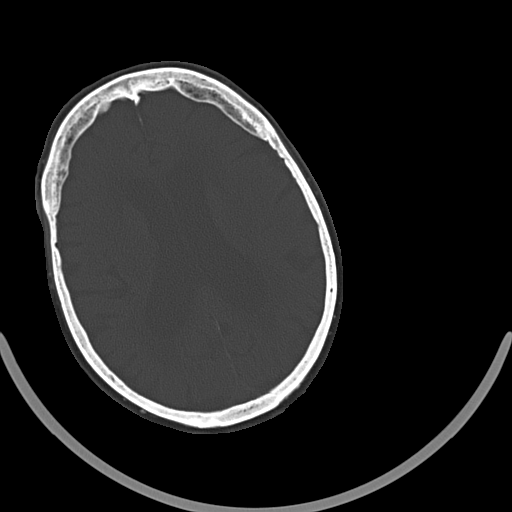
[im 42/63  bone]
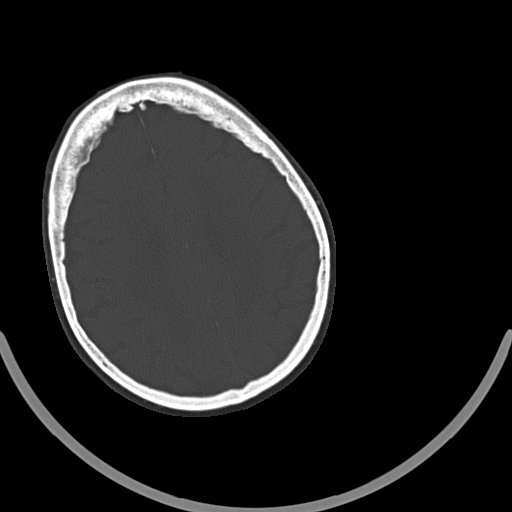
[im 49/63  bone]
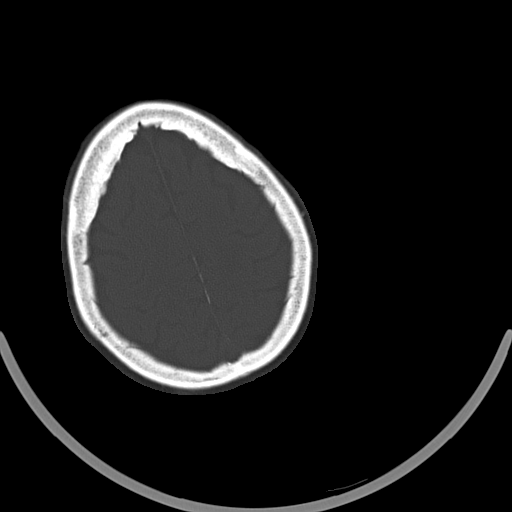
[im 56/63  bone]
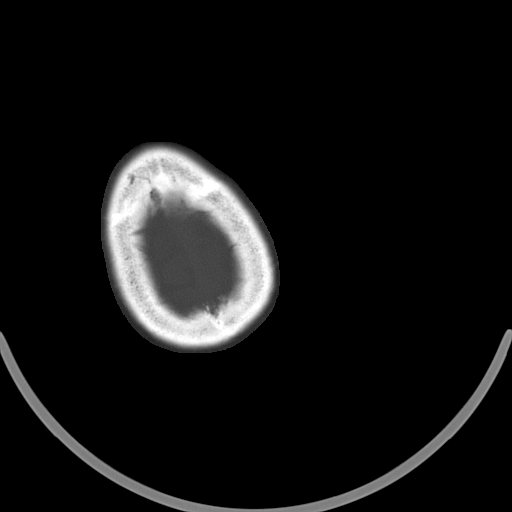

[Series 4: head w/o · axial · non-contrast · 0.49mm/px · z∈[+106,+186]mm · 3 of 32 slices shown (2 of 2)]
[im 8/32  brain]
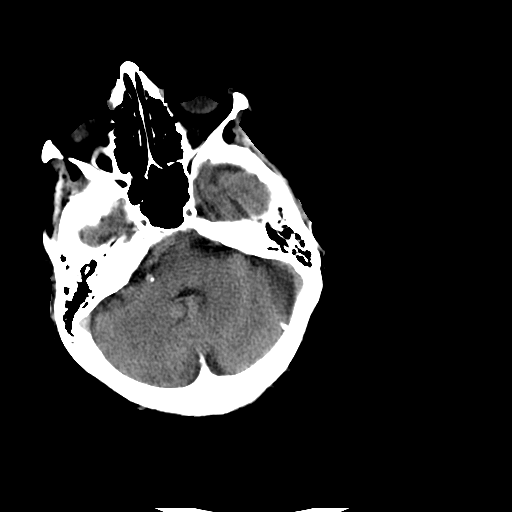
[im 16/32  brain]
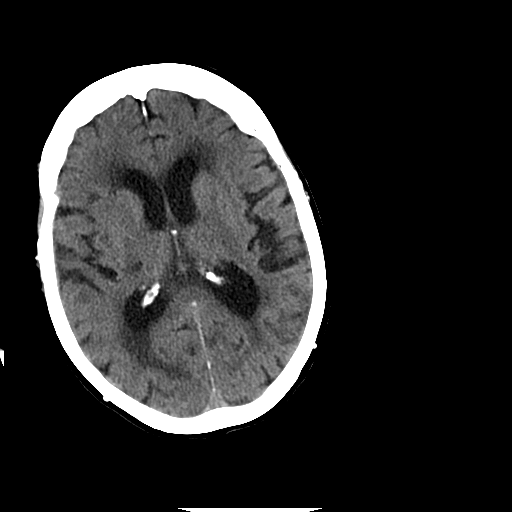
[im 24/32  brain]
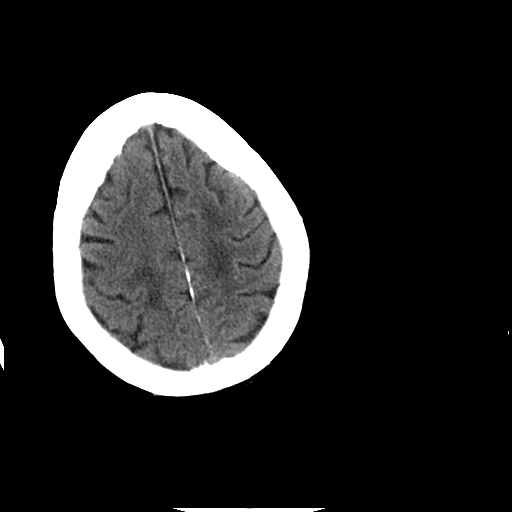

[Series 5: head w/o bone · axial · non-contrast · 0.49mm/px · z∈[+86,+139]mm · 4 of 63 slices shown (2 of 2)]
[im 7/63  bone]
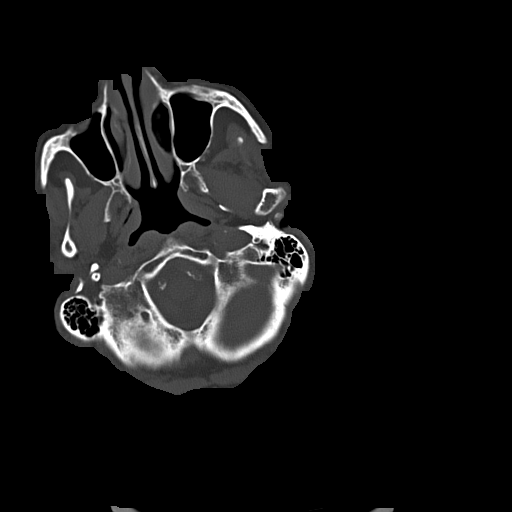
[im 14/63  bone]
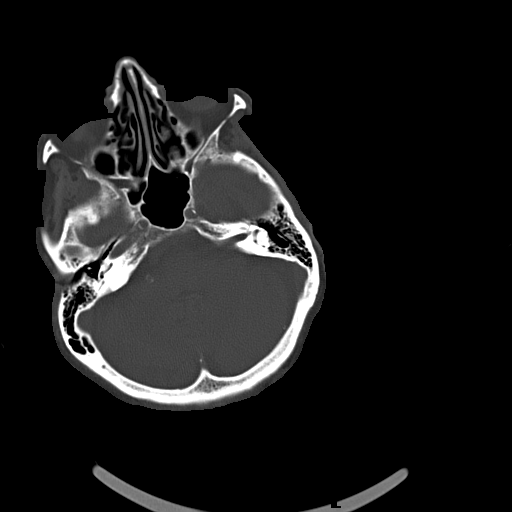
[im 21/63  bone]
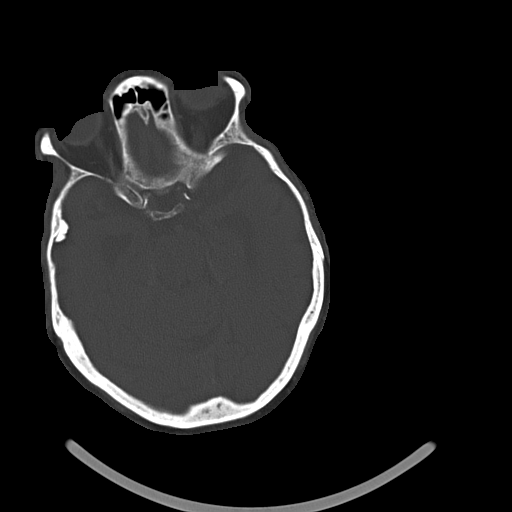
[im 28/63  bone]
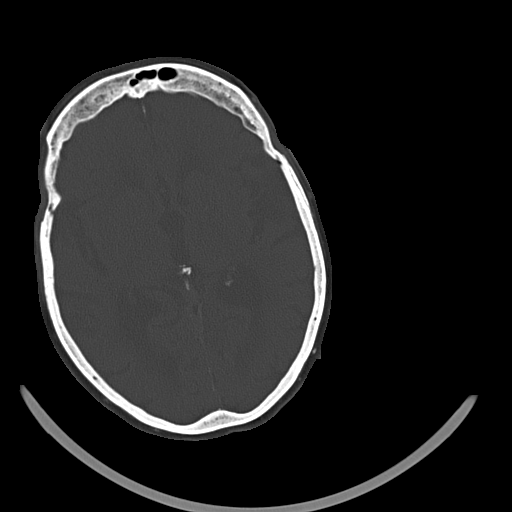

[18 of 30 positions shown; findings below may reference images not displayed]

FINDINGS: No evidence of parenchymal hemorrhage or extra-axial fluid
collection. No mass lesion, mass effect, or midline shift.

No CT evidence of acute infarction.

Old right thalamic lacunar infarct (series 2/ image 16).

Subcortical white matter and periventricular small vessel ischemic
changes. Intracranial atherosclerosis.

Age related cortical atrophy.  No ventriculomegaly.

The visualized paranasal sinuses are essentially clear. The mastoid
air cells are unopacified.

No evidence of calvarial fracture.
IMPRESSION: No evidence of acute intracranial abnormality.

Old right thalamic lacunar infarct.

Small vessel ischemic changes with intracranial atherosclerosis.

These results were called by telephone at the time of interpretation
on 01/12/2014 at [DATE] to Dr. Feehtal, who verbally acknowledged
these results.
